# Patient Record
Sex: Female | Born: 1955 | Marital: Single | Smoking: Never smoker
Health system: Southern US, Community
[De-identification: ages and names within clinical notes are randomized; demographics above are authoritative.]

## PROBLEM LIST (undated history)

## (undated) DIAGNOSIS — F32A Depression, unspecified: Secondary | ICD-10-CM

## (undated) DIAGNOSIS — H3530 Unspecified macular degeneration: Secondary | ICD-10-CM

## (undated) DIAGNOSIS — D649 Anemia, unspecified: Secondary | ICD-10-CM

## (undated) DIAGNOSIS — I10 Essential (primary) hypertension: Secondary | ICD-10-CM

## (undated) DIAGNOSIS — R21 Rash and other nonspecific skin eruption: Secondary | ICD-10-CM

## (undated) DIAGNOSIS — IMO0002 Reserved for concepts with insufficient information to code with codable children: Secondary | ICD-10-CM

## (undated) DIAGNOSIS — R55 Syncope and collapse: Secondary | ICD-10-CM

## (undated) DIAGNOSIS — R011 Cardiac murmur, unspecified: Secondary | ICD-10-CM

## (undated) DIAGNOSIS — E785 Hyperlipidemia, unspecified: Secondary | ICD-10-CM

## (undated) DIAGNOSIS — F329 Major depressive disorder, single episode, unspecified: Secondary | ICD-10-CM

## (undated) DIAGNOSIS — H269 Unspecified cataract: Secondary | ICD-10-CM

## (undated) DIAGNOSIS — F509 Eating disorder, unspecified: Secondary | ICD-10-CM

## (undated) DIAGNOSIS — T7840XA Allergy, unspecified, initial encounter: Secondary | ICD-10-CM

## (undated) DIAGNOSIS — M1990 Unspecified osteoarthritis, unspecified site: Secondary | ICD-10-CM

## (undated) DIAGNOSIS — B019 Varicella without complication: Secondary | ICD-10-CM

## (undated) HISTORY — DX: Hyperlipidemia, unspecified: E78.5

## (undated) HISTORY — DX: Anemia, unspecified: D64.9

## (undated) HISTORY — DX: Essential (primary) hypertension: I10

## (undated) HISTORY — DX: Cardiac murmur, unspecified: R01.1

## (undated) HISTORY — DX: Unspecified osteoarthritis, unspecified site: M19.90

## (undated) HISTORY — DX: Major depressive disorder, single episode, unspecified: F32.9

## (undated) HISTORY — DX: Unspecified macular degeneration: H35.30

## (undated) HISTORY — DX: Allergy, unspecified, initial encounter: T78.40XA

## (undated) HISTORY — PX: OTHER SURGICAL HISTORY: SHX169

## (undated) HISTORY — DX: Depression, unspecified: F32.A

## (undated) HISTORY — DX: Eating disorder, unspecified: F50.9

## (undated) HISTORY — DX: Rash and other nonspecific skin eruption: R21

## (undated) HISTORY — DX: Varicella without complication: B01.9

## (undated) HISTORY — DX: Unspecified cataract: H26.9

## (undated) HISTORY — DX: Reserved for concepts with insufficient information to code with codable children: IMO0002

## (undated) HISTORY — DX: Syncope and collapse: R55

---

## 1978-02-15 DIAGNOSIS — IMO0002 Reserved for concepts with insufficient information to code with codable children: Secondary | ICD-10-CM

## 1978-02-15 HISTORY — DX: Reserved for concepts with insufficient information to code with codable children: IMO0002

## 1978-10-17 DIAGNOSIS — R55 Syncope and collapse: Secondary | ICD-10-CM

## 1978-10-17 HISTORY — DX: Syncope and collapse: R55

## 1990-02-15 HISTORY — PX: RETINAL DETACHMENT SURGERY: SHX105

## 2011-05-10 ENCOUNTER — Encounter: Payer: Self-pay | Admitting: Family Medicine

## 2011-05-10 ENCOUNTER — Ambulatory Visit (INDEPENDENT_AMBULATORY_CARE_PROVIDER_SITE_OTHER): Payer: Medicare Other | Admitting: Family Medicine

## 2011-05-10 VITALS — BP 142/82 | HR 86 | Temp 97.3°F | Ht 66.0 in | Wt 389.4 lb

## 2011-05-10 DIAGNOSIS — E785 Hyperlipidemia, unspecified: Secondary | ICD-10-CM

## 2011-05-10 DIAGNOSIS — M7989 Other specified soft tissue disorders: Secondary | ICD-10-CM | POA: Insufficient documentation

## 2011-05-10 DIAGNOSIS — D485 Neoplasm of uncertain behavior of skin: Secondary | ICD-10-CM

## 2011-05-10 DIAGNOSIS — L400 Psoriasis vulgaris: Secondary | ICD-10-CM

## 2011-05-10 DIAGNOSIS — I10 Essential (primary) hypertension: Secondary | ICD-10-CM | POA: Insufficient documentation

## 2011-05-10 DIAGNOSIS — L732 Hidradenitis suppurativa: Secondary | ICD-10-CM

## 2011-05-10 DIAGNOSIS — B353 Tinea pedis: Secondary | ICD-10-CM

## 2011-05-10 DIAGNOSIS — L408 Other psoriasis: Secondary | ICD-10-CM

## 2011-05-10 DIAGNOSIS — G4730 Sleep apnea, unspecified: Secondary | ICD-10-CM

## 2011-05-10 DIAGNOSIS — E039 Hypothyroidism, unspecified: Secondary | ICD-10-CM | POA: Insufficient documentation

## 2011-05-10 LAB — CBC WITH DIFFERENTIAL/PLATELET
Basophils Absolute: 0 10*3/uL (ref 0.0–0.1)
HCT: 47.5 % — ABNORMAL HIGH (ref 36.0–46.0)
Lymphs Abs: 0.8 10*3/uL (ref 0.7–4.0)
MCHC: 33.2 g/dL (ref 30.0–36.0)
MCV: 89 fl (ref 78.0–100.0)
Monocytes Absolute: 0.4 10*3/uL (ref 0.1–1.0)
Platelets: 214 10*3/uL (ref 150.0–400.0)
RDW: 14.7 % — ABNORMAL HIGH (ref 11.5–14.6)

## 2011-05-10 LAB — HEPATIC FUNCTION PANEL
Albumin: 3 g/dL — ABNORMAL LOW (ref 3.5–5.2)
Alkaline Phosphatase: 95 U/L (ref 39–117)
Bilirubin, Direct: 0 mg/dL (ref 0.0–0.3)

## 2011-05-10 LAB — BASIC METABOLIC PANEL
Calcium: 8.7 mg/dL (ref 8.4–10.5)
Creatinine, Ser: 1 mg/dL (ref 0.4–1.2)
GFR: 58.12 mL/min — ABNORMAL LOW (ref >60.00)
Sodium: 141 mEq/L (ref 135–145)

## 2011-05-10 LAB — LIPID PANEL
Cholesterol: 278 mg/dL — ABNORMAL HIGH (ref 0–200)
HDL: 49 mg/dL (ref >39.00)
Total CHOL/HDL Ratio: 6
Triglycerides: 162 mg/dL — ABNORMAL HIGH (ref 0.0–149.0)

## 2011-05-10 LAB — LDL CHOLESTEROL, DIRECT: Direct LDL: 197 mg/dL

## 2011-05-10 MED ORDER — DOXYCYCLINE HYCLATE 100 MG PO TABS: 100.0000 mg | ORAL_TABLET | Freq: Two times a day (BID) | ORAL | Status: AC

## 2011-05-10 NOTE — Progress Notes (Signed)
  Subjective:    Patient ID: Whitney Blackburn, female    DOB: 1955-10-20, 56 y.o.   MRN: [id number]  HPI New to establish.  Has not had MD in 3 yrs due to lack of insurance.  Now disabled due to mobility issues.  HTN- chronic problem, was on Lisinopril HCTZ 10/12.5 mg daily, has not been on meds x3 yrs.  No CP.  + SOB w/ any type of exertion.  Has had 60 lb weight loss in last 3 yrs.  Hyperlipidemia- chronic problem, was on Simvastatin 80mg  daily.  No meds in 3 yrs.  Hypothyroid- chronic problem, was previous on 125 mcg daily, no meds in 3 yrs.  Mobility issues- pt reports 'i'm basically home bound'.  + L leg swelling.  Reports she does not add salt to diet but is limited to processed/frozen dinners w/ high Na content.  Has never seen vascular MD.  Reports that she will have intermittent weeping blisters of L leg.  Rash- only occuring on neck.  Thick, red plaques.  Pt reports this is very itchy.  Foot infection- pt report large purple area on top of foot appeared 9 months ago.  Unable to cut nails.  + fungal infection.  Feet 'tingle' bilaterally.  Eye issues- had retinal detachment in R eye, macular degeneration in L eye.  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  Now w/ cataracts but 'i can't find someone to operate on them'.  Sleep Apnea- pt reports she had the study 10 yrs ago but was unable to tolerate a mask.  Has not had recent study or pulmonary follow up.  Hidradenitis- pt reports 18 months of pain, swelling, fluid collection.  Health maintenance- has never had pap or colonoscopy.  Last mammo 3 yrs ago.   Review of Systems For ROS see HPI     Objective:   Physical Exam  Vitals reviewed. Constitutional: She appears well-developed and well-nourished. No distress.       Morbidly obese, extremely limited mobility  HENT:  Head: Normocephalic and atraumatic.  Neck: Neck supple. No thyromegaly present.  Cardiovascular: Normal rate, regular rhythm, normal heart sounds and intact distal  pulses.   Pulmonary/Chest: Effort normal and breath sounds normal. No respiratory distress. She has no wheezes. She has no rales.  Abdominal: Soft.       Morbidly obese  Musculoskeletal: She exhibits edema (bilateral, +2, pt w/ ulcerated wound on dorsum of L foot).  Lymphadenopathy:    She has no cervical adenopathy.  Skin: Skin is warm and dry. Rash: thick red plaques on neck.       Axillary hidradenitis Toenails thickened, yellow, dystrophic- consistent w/ fungal infxn          Assessment & Plan:

## 2011-05-10 NOTE — Patient Instructions (Signed)
We will notify you of your lab results We will have home health come do an evaluation for possible mobility assistance The care management team will contact you to help w/ transportation and other needs Start the antibiotics for the area under your arm Someone will contact you with your specialist appts Call with any questions or concerns Hang in there!  We're going to get everything taken care of!

## 2011-05-11 ENCOUNTER — Telehealth: Payer: Self-pay | Admitting: Family Medicine

## 2011-05-11 NOTE — Telephone Encounter (Signed)
Myriam Jacobson with [facility] office is assisting with my referrals.  She had this patient scheduled for all her referral appointments entered yesterday, 05-10-11, called patient, and patient states she doesn't want any of the appointments.  Patient told Myriam Jacobson, she only wants the Home Health referral in which Tresa Moore is processing.

## 2011-05-11 NOTE — Telephone Encounter (Signed)
Please advise 

## 2011-05-11 NOTE — Telephone Encounter (Signed)
At this point I'm not sure what to do.  Pt has multiple serious health concerns that need to be evaluated by specialists.  If she is not willing to follow treatment plan, we may need to talk about finding another doctor.

## 2011-05-11 NOTE — Telephone Encounter (Signed)
Called Michelle at [employer] to advise pt need for transportation, was advised company currently does not assist with [employer] however to check back again in the future to see if this is later accepted. MD Beverely Low made aware verbally.

## 2011-05-12 ENCOUNTER — Encounter: Payer: Self-pay | Admitting: *Deleted

## 2011-05-13 MED ORDER — LEVOTHYROXINE SODIUM 100 MCG PO TABS: 100.0000 ug | ORAL_TABLET | Freq: Every day | ORAL | Status: AC

## 2011-05-13 MED ORDER — HYDROCHLOROTHIAZIDE 12.5 MG PO CAPS: 12.5000 mg | ORAL_CAPSULE | Freq: Every day | ORAL | Status: DC

## 2011-05-13 MED ORDER — ATORVASTATIN CALCIUM 40 MG PO TABS: 40.0000 mg | ORAL_TABLET | Freq: Every day | ORAL | Status: DC

## 2011-05-13 NOTE — Telephone Encounter (Signed)
Spoke to pt to advise results/instructions. Pt understood. Pt requested to have all her medications mailed to her from this point on per she needs to verify how much they are per her finances, pt also notes that she does not want to set her referral apts up per she is not set up with transportation at this point pt noted that the apts for referrals need to be placed in the afternoon per it takes her so long to get ready sometimes hours, pt also wants the referrals spaced out due to her financial state and unable to pay all the co-pays involved , pt also wanted to review the MD offices that we are sending her to per not all MD offices are NOT handicap accessible per pt notes that requirement for her is that they have no steps, as well as need to have chairs with no arms on them so she can fit into the chair, pt then noted that our office does not have steps and we do have the sofa without arms however the sofa was too low for her to be able to get up, pt also noted our office does not have armless chairs in the lab dept of our office. I advised pt that we will send the medication prescriptions to her mail so she can review before sending them off to her pharmacy, address was clarified as well, advised that we have contacted [facility] with a referral for home health and that someone will contact her from the office concerning coming out for evaluation, also noted [facility] has been notified to contact pt concerning transportation needs and that someone from that office will contact her about that as well, noted that I will let my manager know about her concerns with the chairs in the lobby and lab, I also advised that I would inform MD Tabori about her needs for referral office information prior to apt and her financial needs to spread the apts apart, advised referral coordinator that pt is waiting on transportation to be set up before accepting referrals.

## 2011-05-20 ENCOUNTER — Other Ambulatory Visit: Payer: Self-pay

## 2011-05-20 DIAGNOSIS — M7989 Other specified soft tissue disorders: Secondary | ICD-10-CM

## 2011-05-21 ENCOUNTER — Telehealth: Payer: Self-pay | Admitting: *Deleted

## 2011-05-21 NOTE — Telephone Encounter (Signed)
Whitney Blackburn with [facility] to advise that pt needs transportation to appointments per was advised on last phone call to [facility] that at the present time pt insurance was not being accepted, Marcelino Duster advised they will reach out to the pt, called pt to advise update, pt understood.

## 2011-05-23 NOTE — Assessment & Plan Note (Signed)
New.  Pt has been off BP meds x3 yrs.  Restart HCTZ to improve BP and improve swelling.

## 2011-05-23 NOTE — Assessment & Plan Note (Signed)
New.  Refer to derm. 

## 2011-05-23 NOTE — Assessment & Plan Note (Signed)
New to provider.  Chronic problem for pt.  Start doxy.  Refer to general surgery for continued management.

## 2011-05-23 NOTE — Assessment & Plan Note (Signed)
New to provider.  Pt's biggest health concern at this time.  Cause of disability, limited mobility, and largest risk for comorbidity and death.  Check labs b/c pt is at high risk for DM.  Set pt up w/ care management services to get pt transportation and home health eval to assess DME needs/home safety.

## 2011-05-23 NOTE — Assessment & Plan Note (Signed)
New to provider.  Hx of similar.  Check labs to determine appropriate meds.

## 2011-05-23 NOTE — Assessment & Plan Note (Signed)
New to provider- obviously chronic for pt.  Refer to podiatry for tx of dystrophic nails and foot wound.

## 2011-05-23 NOTE — Assessment & Plan Note (Signed)
New.  Pt w/ bilateral sxs and wound on dorsum of foot.  Start doxy.  Refer to podiatry and vascular surgery for evaluation and tx.  Reviewed supportive care and red flags that should prompt return.  Pt expressed understanding and is in agreement w/ plan.

## 2011-05-23 NOTE — Assessment & Plan Note (Signed)
New to provider.  Check labs.  Determine starting dose of synthroid.

## 2011-05-23 NOTE — Assessment & Plan Note (Signed)
New to provider.  Hx of similar.  Not currently on CPAP.  Needs pulm referral.

## 2011-06-02 ENCOUNTER — Institutional Professional Consult (permissible substitution): Payer: Medicare Other | Admitting: Pulmonary Disease

## 2011-06-15 ENCOUNTER — Encounter: Payer: Self-pay | Admitting: Family Medicine

## 2011-06-15 NOTE — Telephone Encounter (Signed)
Please note incoming email, vm has been left again with [facility] to confirm transportation

## 2011-06-16 ENCOUNTER — Telehealth: Payer: Self-pay | Admitting: *Deleted

## 2011-06-16 ENCOUNTER — Encounter: Payer: Self-pay | Admitting: Family Medicine

## 2011-06-16 NOTE — Telephone Encounter (Signed)
Marcelino Duster from [facility] called to advise that pt case manager Ellenor [phone] has spoken to the social worker for the pt and they will contact the pt this afternoon about transportation, MD Tabori made aware verbally

## 2011-06-16 NOTE — Telephone Encounter (Signed)
Late entry: left vm for michelle with [facility] on 06-15-11 at 5:30pm to advise the transportation we discussed for this pt has not been set up as of present time, pt has an apt on 06-30-11 that she needs to attend. Left my personal extension to call back, per pt sent email via mychart about concern     This nurse left vm again today at 8:53am for [facility]  to call back to my extension per MD Beverely Low now aware of the concern and this is an important matter for the pt to have transportation to her apt(s) and especially on 06-30-11.

## 2011-06-21 ENCOUNTER — Telehealth: Payer: Self-pay | Admitting: *Deleted

## 2011-06-21 ENCOUNTER — Telehealth: Payer: Self-pay | Admitting: Family Medicine

## 2011-06-21 NOTE — Telephone Encounter (Signed)
Left Vm with Ellenor with [facility] to make sure the social worker did speak to the pt about her transportation for upcoming apt on 06-30-11.

## 2011-06-21 NOTE — Telephone Encounter (Signed)
Dismissal Letter sent by Certified Mail 06/21/2011  Return Receipt received showing the patient has the Dismissal Letter 06/23/2011

## 2011-06-22 NOTE — Telephone Encounter (Signed)
Was advised by [facility] rep Ellenor that she did have transportation set up for this pt for her upcoming MD apt on May 15th and that a Child psychotherapist is scheduled to reach out to the pt in home next week to apply for the SCAT program to assist with further transportation needs, pt is aware, Ellenor wanted to notify MD Beverely Low about some concerns with the pt per noted several concerns that the pt refused to address, Ellenor discussed the pt need to attend the apt MD Tabori set up for her concerning the specialist and current health concerns, pt refused advice, pt declined offer for a diet/exercise program to be set up for her, pt also declined offer for a wheelchair per pt complaint of mobility, pt noted that she does not want to pay for any services, Ellenor is scheduled to update with pt in home at the end of the month to see how her apt was and any concerns she may have, pt was hesitant to have Ellenor return,however did agree to have her come back out, MD Tabori made aware verbally

## 2011-06-23 ENCOUNTER — Telehealth: Payer: Self-pay

## 2011-06-23 NOTE — Telephone Encounter (Signed)
Pt. Called to inquire about office accommodations for larger-size patients.  Asking about accessibility of wide chairs, wider stools with handrail, bed availability for larger pt's.  Advised pt. our exam bed can accommodate pts. up to 400#, and our vascular lab bed can accommodate patients up to 500#; our waiting area has a wide-2-seated chair without handle in the middle; door frames are standard 34"clearance.  Pt. states she is not in a w/c, but has difficulty walking very far, before she needs to sit down.  Also states she has to be able to have wide step stool with a strong, supportive handle.  Spoke with vascular lab tech, and advised that scheduling the vascular study at [facility], may be more appropriate to get a better vascular evaluation, in an obese patient.  Pt. stated she prefers to do everything at one office/one stop, because she relies on outside transportation.  She does not want to schedule vascular test at the hospital.  States she is mostly "bedridden", and has difficulty moving around.  Advised pt. will keep her appt., as previously scheduled at office, but will share this discussion with the Vascular Lab Manager, and she may call pt.

## 2011-06-29 ENCOUNTER — Encounter: Payer: Self-pay | Admitting: Vascular Surgery

## 2011-06-30 ENCOUNTER — Encounter (INDEPENDENT_AMBULATORY_CARE_PROVIDER_SITE_OTHER): Payer: Medicare Other

## 2011-06-30 ENCOUNTER — Encounter: Payer: Self-pay | Admitting: Vascular Surgery

## 2011-06-30 ENCOUNTER — Ambulatory Visit (INDEPENDENT_AMBULATORY_CARE_PROVIDER_SITE_OTHER): Payer: Medicare Other | Admitting: Vascular Surgery

## 2011-06-30 VITALS — BP 146/82 | HR 58 | Resp 18 | Ht 68.0 in | Wt 380.0 lb

## 2011-06-30 DIAGNOSIS — M79609 Pain in unspecified limb: Secondary | ICD-10-CM

## 2011-06-30 DIAGNOSIS — I872 Venous insufficiency (chronic) (peripheral): Secondary | ICD-10-CM

## 2011-06-30 DIAGNOSIS — M7989 Other specified soft tissue disorders: Secondary | ICD-10-CM

## 2011-06-30 NOTE — Assessment & Plan Note (Signed)
I believe that her leg swelling is secondary to combined chronic venous insufficiency and lymphedema. I've explained to her that this is a chronic problem. We have reviewed the treatment of this. She understands that the main stay of treatment is elevation and compression therapy. I've discussed with her the proper position for elevating her legs. Once her swelling has improved she could potentially be fitted for compression stockings. We have also discussed the importance of weight loss. I've also instructed her to keep the skin well-lubricated to prevent cracks which can start venous ulcers. I'll be happy to see her back if her symptoms progress or any new vascular issues arise.

## 2011-06-30 NOTE — Progress Notes (Signed)
Vascular and Vein Specialist of [facility]  Patient name: Whitney Blackburn MRN: [id number] DOB: 20-May-1955 Sex: female  REASON FOR CONSULT: bilateral lower extremity swelling, left greater than right  HPI: Whitney Blackburn is a 56 y.o. female who has had swelling in both lower extremities off and on for 10 years.  She is unaware of any previous history of DVT or phlebitis. She denies any previous abdominal surgery groin surgery or radiation therapy. She states that the swelling had been worse recently where she was having wounds in her left leg which were weeping serous fluid. She denies having had any venous ulcers however. She is on disability because of arthritis in her left knee and is essentially bedridden because of her morbid obesity.  Past Medical History  Diagnosis Date  . Arthritis   . Chicken pox   . Depression   . Fainting spell 1980's    noted in 1980's  . Eating disorder     1980's  . Glaucoma   . Ulcer 1980  . Allergy   . Heart murmur   . Hyperlipidemia   . Hypertension     noted high readings in the past, per has not been to MD in 3 years  . Skin rash   . Macular degeneration   . Anemia   . Cataracts, bilateral     Family History  Problem Relation Age of Onset  . Hyperlipidemia Mother   . Crohn's disease Father   . Early death Father   . Diabetes Sister   . Crohn's disease Paternal Uncle   . Hyperlipidemia Maternal Grandmother   . Hypertension Maternal Grandmother   . Hyperlipidemia Maternal Grandfather   . Hypertension Maternal Grandfather     SOCIAL HISTORY: History  Substance Use Topics  . Smoking status: Never Smoker   . Smokeless tobacco: Not on file  . Alcohol Use: No    Allergies  Allergen Reactions  . Dicloxacillin   . Shellfish Allergy     Current Outpatient Prescriptions  Medication Sig Dispense Refill  . atorvastatin (LIPITOR) 40 MG tablet Take 1 tablet (40 mg total) by mouth daily.  90 tablet  1  . hydrochlorothiazide (MICROZIDE)  12.5 MG capsule Take 1 capsule (12.5 mg total) by mouth daily.  90 capsule  1  . levothyroxine (SYNTHROID, LEVOTHROID) 100 MCG tablet Take 1 tablet (100 mcg total) by mouth daily.  90 tablet  1  . multivitamin-iron-minerals-folic acid (CENTRUM) chewable tablet Chew 1 tablet by mouth daily.        REVIEW OF SYSTEMS: Arly.Keller ] denotes positive finding; [  ] denotes negative finding  CARDIOVASCULAR:  [ ]  chest pain   [ ]  chest pressure   [ ]  palpitations   [ ]  orthopnea   Arly.Keller ] dyspnea on exertion   [ ]  claudication   [ ]  rest pain   [ ]  DVT   [ ]  phlebitis PULMONARY:   [ ]  productive cough   [ ]  asthma   [ ]  wheezing NEUROLOGIC:   [ ]  weakness  [ ]  paresthesias  [ ]  aphasia  [ ]  amaurosis  [ ]  dizziness HEMATOLOGIC:   [ ]  bleeding problems   [ ]  clotting disorders MUSCULOSKELETAL:  Arly.Keller ] joint pain left knee  [ ]  joint swelling [ ]  leg swelling GASTROINTESTINAL: [ ]   blood in stool  [ ]   hematemesis GENITOURINARY:  [ ]   dysuria  [ ]   hematuria PSYCHIATRIC:  [ ]  history of major depression INTEGUMENTARY:  [  X ] rashes left leg [ ]  ulcers CONSTITUTIONAL:  [ ]  fever   [ ]  chills  PHYSICAL EXAM: Filed Vitals:   06/30/11 1421  BP: 146/82  Pulse: 58  Resp: 18  Height: 5\' 8"  (1.727 m)  Weight: 380 lb (172.367 kg)   Body mass index is 57.78 kg/(m^2). GENERAL: The patient is a well-nourished female, in no acute distress. The vital signs are documented above. CARDIOVASCULAR: There is a regular rate and rhythm without significant murmur appreciated. I do not detect carotid bruits. I am unable to palpate pedal pulses however both feet are warm and well-perfused. She has bilateral lower extremity swelling. This is more significant on the left side. She has nonpitting edema on the dorsum of both feet again more so on the left. PULMONARY: There is good air exchange bilaterally without wheezing or rales. ABDOMEN: Soft and non-tender with normal pitched bowel sounds.  MUSCULOSKELETAL: There are no major  deformities or cyanosis. NEUROLOGIC: No focal weakness or paresthesias are detected. SKIN: she has hyperpigmentation in the left leg.PSYCHIATRIC: The patient has a normal affect.  DATA:  Lab Results  Component Value Date   WBC 7.9 05/10/2011   HGB 15.8* 05/10/2011   HCT 47.5* 05/10/2011   MCV 89.0 05/10/2011   PLT 214.0 05/10/2011   Lab Results  Component Value Date   NA 141 05/10/2011   K 3.9 05/10/2011   CL 104 05/10/2011   CO2 26 05/10/2011   Lab Results  Component Value Date   CREATININE 1.0 05/10/2011   Lab Results  Component Value Date   HGBA1C 5.6 05/10/2011   I have independently interpreted her venous study which shows no evidence of DVT on the left. She has reflux in the left greater saphenous vein and also reflux in the common femoral vein on the left.  MEDICAL ISSUES: Leg swelling I believe that her leg swelling is secondary to combined chronic venous insufficiency and lymphedema. I've explained to her that this is a chronic problem. We have reviewed the treatment of this. She understands that the main stay of treatment is elevation and compression therapy. I've discussed with her the proper position for elevating her legs. Once her swelling has improved she could potentially be fitted for compression stockings. We have also discussed the importance of weight loss. I've also instructed her to keep the skin well-lubricated to prevent cracks which can start venous ulcers. I'll be happy to see her back if her symptoms progress or any new vascular issues arise.    Liviana Mills S [facility] of [place]: [phone]

## 2011-07-08 NOTE — Procedures (Unsigned)
LOWER EXTREMITY VENOUS REFLUX EXAM  INDICATION:  Left lower extremity venous insufficiency and edema.  EXAM:  Using color-flow imaging and pulse Doppler spectral analysis, the left common femoral, superficial femoral, popliteal, posterior tibial, greater and lesser saphenous veins are evaluated.  There is evidence suggesting deep venous insufficiency in the left lower extremity in the common femoral vein.  The left saphenofemoral junction is not competent with Reflux of >500 milliseconds. The left GSV is not competent with Reflux of >500 milliseconds with the caliber as described below.)   The left proximal short saphenous vein demonstrates competency.  GSV Diameter (used if found to be incompetent only)                                           Right    Left Proximal Greater Saphenous Vein           cm       0.94 cm Proximal-to-mid-thigh                     cm       0.87 cm Mid thigh                                 cm       0.48 cm Mid-distal thigh                          cm       cm Distal thigh                              cm       0.68 cm Knee                                      cm       0.40 cm   IMPRESSION: 1. The left greater saphenous vein is not competent with reflux >500     milliseconds. 2. The left greater saphenous vein is not tortuous. 3. The deep saphenous vein is not competent with Reflux of >500     milliseconds. 4. The left lesser saphenous vein is competent.           ___________________________________________ Di Kindle. Edilia Bo, M.D.  CI/MEDQ  D:  06/30/2011  T:  06/30/2011  Job:  [id number]

## 2011-08-06 ENCOUNTER — Telehealth: Payer: Self-pay | Admitting: Family Medicine

## 2011-08-06 NOTE — Telephone Encounter (Signed)
Levert Feinstein of home health plan of care 6.20.13 - gave to OM Janna Arch, whom consulted medical staff. Patient has been dismissed from practice, advised to call [facility] & advise no longer PCP and have no new PCP listed on file. spoek to [employer] that answers phone & then Moore.

## 2011-08-06 NOTE — Telephone Encounter (Signed)
Noted  

## 2012-04-01 ENCOUNTER — Other Ambulatory Visit: Payer: Self-pay

## 2012-12-21 ENCOUNTER — Other Ambulatory Visit: Payer: Self-pay

## 2017-12-20 ENCOUNTER — Ambulatory Visit: Payer: Medicare Other | Admitting: Podiatry

## 2017-12-20 VITALS — BP 153/78

## 2017-12-20 DIAGNOSIS — I890 Lymphedema, not elsewhere classified: Secondary | ICD-10-CM | POA: Diagnosis not present

## 2017-12-20 DIAGNOSIS — M79674 Pain in right toe(s): Secondary | ICD-10-CM | POA: Diagnosis not present

## 2017-12-20 DIAGNOSIS — B351 Tinea unguium: Secondary | ICD-10-CM

## 2017-12-20 DIAGNOSIS — M79675 Pain in left toe(s): Secondary | ICD-10-CM

## 2017-12-20 NOTE — Patient Instructions (Signed)

## 2018-01-07 ENCOUNTER — Encounter: Payer: Self-pay | Admitting: Podiatry

## 2018-01-07 NOTE — Progress Notes (Signed)
Subjective: Whitney Blackburn presents to clinic today with multiple concerns.  She states she has seen a podiatrist in the past is been over 10 years ago.  She is here today for her toenails which are elongated, painful, and discolored.  Due to her mobility issues she cannot cut them herself.  She also has chronic lymphedema left greater than right.  She states her left foot swells more so on occasion.  Lastly she states that there is a black spot on her right foot that is come up and she cannot get it off.  She is also requesting compression stockings to help her with her edema.    Past Medical History:  Diagnosis Date  . Allergy   . Anemia   . Arthritis   . Cataracts, bilateral   . Chicken pox   . Depression   . Eating disorder    1980's  . Fainting spell 1980's   noted in 1980's  . Glaucoma   . Heart murmur   . Hyperlipidemia   . Hypertension    noted high readings in the past, per has not been to MD in 3 years  . Macular degeneration   . Skin rash   . Ulcer 1980   Patient Active Problem List   Diagnosis Date Noted  . Unspecified venous (peripheral) insufficiency 06/30/2011  . Pain in limb 06/30/2011  . HTN (hypertension) 05/10/2011  . Hyperlipidemia 05/10/2011  . Hypothyroid 05/10/2011  . Leg swelling 05/10/2011  . Hidradenitis axillaris 05/10/2011  . Plaque psoriasis 05/10/2011  . Fungal infection of foot 05/10/2011  . Neoplasm of uncertain behavior of skin 05/10/2011  . Sleep apnea 05/10/2011  . Morbid obesity ([place]) 05/10/2011   Past Surgical History:  Procedure Laterality Date  . retina    . RETINAL DETACHMENT SURGERY  1992   Current Outpatient Medications:  .  acetaminophen (TYLENOL) 500 MG tablet, Take 500 mg by mouth every 6 (six) hours as needed., Disp: , Rfl:  .  amitriptyline (ELAVIL) 50 MG tablet, Take 50 mg by mouth at bedtime., Disp: , Rfl:  .  atorvastatin (LIPITOR) 40 MG tablet, Take 40 mg by mouth daily., Disp: , Rfl:  .  Cholecalciferol (VITAMIN  D3) 1000 UNIT/SPRAY LIQD, Take 500 Units by mouth., Disp: , Rfl:  .  fenofibrate 54 MG tablet, Take 54 mg by mouth daily., Disp: , Rfl:  .  glipiZIDE (GLUCOTROL) 5 MG tablet, Take by mouth daily before breakfast., Disp: , Rfl:  .  hydrochlorothiazide (HYDRODIURIL) 25 MG tablet, Take 25 mg by mouth daily., Disp: , Rfl:  .  Levothyroxine Sodium 13 MCG/ML SOLN, Take by mouth., Disp: , Rfl:  .  lisinopril (PRINIVIL,ZESTRIL) 40 MG tablet, Take 40 mg by mouth daily., Disp: , Rfl:  .  Melatonin 3 MG TABS, Take 3 mg by mouth., Disp: , Rfl:  .  metFORMIN (GLUCOPHAGE-XR) 750 MG 24 hr tablet, Take 750 mg by mouth daily with breakfast., Disp: , Rfl:  .  Multiple Vitamins-Minerals (CENTRUM SILVER 50+WOMEN PO), Take by mouth., Disp: , Rfl:  .  multivitamin-iron-minerals-folic acid (CENTRUM) chewable tablet, Chew 1 tablet by mouth daily., Disp: , Rfl:  .  Potassium 99 MG TABS, Take 99 mg by mouth., Disp: , Rfl:  .  ranitidine (ZANTAC) 150 MG tablet, Take 150 mg by mouth 2 (two) times daily., Disp: , Rfl:  .  hydrochlorothiazide (MICROZIDE) 12.5 MG capsule, Take 1 capsule (12.5 mg total) by mouth daily., Disp: 90 capsule, Rfl: 1 .  levothyroxine (  SYNTHROID, LEVOTHROID) 100 MCG tablet, Take 1 tablet (100 mcg total) by mouth daily., Disp: 90 tablet, Rfl: 1 .  levothyroxine (SYNTHROID, LEVOTHROID) 100 MCG tablet, , Disp: , Rfl:   Allergies  Allergen Reactions  . Dicloxacillin   . Shellfish Allergy    Social History   Tobacco Use  Smoking Status Never Smoker  Smokeless Tobacco Never Used   Family History  Problem Relation Age of Onset  . Hyperlipidemia Mother   . Crohn's disease Father   . Early death Father   . Diabetes Sister   . Crohn's disease Paternal Uncle   . Hyperlipidemia Maternal Grandmother   . Hypertension Maternal Grandmother   . Hyperlipidemia Maternal Grandfather   . Hypertension Maternal Grandfather     Objective: Vitals:   12/20/17 1411  BP: (!) 153/78   Vascular  Examination: Capillary refill time immediate x 10 digits Dorsalis pedis palpable bilaterally  Posterior tibial pulses I am unable to palpate secondary to edema No digital hair x 10 digits Skin temperature gradient within normal limits bilaterally Pedal edema noted, consistent with lymphedema changes left greater than right.  Dermatological Examination: Skin texture is consistent with chronic lymphedema. Toenails 1-5 b/l discolored, thick, dystrophic with subungual debris and pain with palpation to nailbeds due to thickness of nails. The dark spots she is referring to is a dry skin plaque.  When removed, normal skin integument noted. No open wounds bilaterally No interdigital macerations noted bilaterally  Musculoskeletal: Muscle strength 5/5 to all LE muscle groups  Neurological: Sensation intact with 10 gram monofilament. Vibratory sensation intact.  Assessment: Painful onychomycosis toenails 1-5 b/l  Lymphedema bilateral lower extremities   Plan: 1. Toenails 1-5 b/l were debrided in length and girth without iatrogenic bleeding. 2. For lymphedema, a prescription written for a pair of custom thigh-high stockings 20 to 30 mmHg.  She will obtain from [facility] supply. 3. Patient to continue soft, supportive shoe gear 4. Patient to report any pedal injuries to medical professional immediately. 5. Follow up 3 months. Patient/POA to call should there be a concern in the interim.

## 2019-06-08 ENCOUNTER — Other Ambulatory Visit: Payer: Self-pay | Admitting: *Deleted

## 2019-06-08 NOTE — Patient Outreach (Signed)
[place] [facility]) Care Management  06/08/2019  Whitney Blackburn [birth year] [id number]   Telephone Assessment  RN initial outreach attempt however unsuccessful. RN able to leave a HIPAA approved voice message requesting a call back.  Plan: Will attempted another follow up over the next 2 weeks.  [name], RN Care Management Coordinator [place] Office [phone]

## 2019-06-20 ENCOUNTER — Other Ambulatory Visit: Payer: Self-pay | Admitting: *Deleted

## 2019-06-20 NOTE — Patient Outreach (Signed)
[facility]) Care Management  06/20/2019  Roshni Mccrite Jul 15, 1955 [id number]  MD referral Telephone Assessment Outreach #2  RN attempted outreach however unsuccessful. RN able to leave a HIPAA approved voice message requesting a call back for pending services.   Plan: Will reach out once again next week and inquire further.  [name], RN Care Management Coordinator [place] Office [phone]

## 2019-06-28 ENCOUNTER — Encounter: Payer: Self-pay | Admitting: *Deleted

## 2019-06-28 ENCOUNTER — Other Ambulatory Visit: Payer: Self-pay | Admitting: *Deleted

## 2019-06-28 NOTE — Patient Outreach (Signed)
[place] [facility]) Care Management  06/28/2019  Whitney Blackburn October 23, 1955 [id number]   Telephone Assessment-Successful-Enrolled for Diabetic Program  RN spoke with pt today and once again introduced the [facility] program and services. Pt receptive and continue to express her issues. States she is obese (380 lbs) and very limited with her mobility and standing ability. States she does not have a bariatric wheelchair only a walker and it is very difficult for the medical providers office to accomodates her needs when she appears for her appointments. Pt is unable to stand long due to her left leg lipidemia (no open wounds at this time). Therefore pt more home bound and does not attend to many office visits. If she needs to she gets her sister Whitney Blackburn) to transport when necessary. Pt will have a virtual appointment today with her primary care provider Dr. Addison Blackburn who follows her for her diabetes (last office visit was in March).  Reports at that time her A1c was around 8.1 but due to her sensitivity to her fingers as pt entertains herself daily with use of her computer and watching TV she has not been taking her BS. Pt is not sure when her provider will obtain another A1c due to her limitations for an office visit but she can report her readings to her provider starting today when her doctor calls she will completed the first BS test and present the reading to Dr. Addison Blackburn.  States she orders her groceries online for a 2 month supply and does not eat a lot of fresh fruits and vegetables with two meals daily. Pt admits she eats more regular food and that are not diabetic friendly but will to consider meal plans that are diabetic friendly if possible.   Diabetes-Will educate and discuss diabetes and how this will effect her ongoing medical issues. Will obtain as much information as possible and find the source of pt's problem related to her increased levels. Pt admits her issues is her diet and  will to make a change if needed with eating more diabetic friendly meals. Will send educational material and continue to discuss on the follow up calls.  [facility] referral for a Social work consult-Based upon the needs for a Bariatric wheelchair, sources of transportation (possibly SCATS) and diabetic friendly meals through mail order  for a [facility] social worker to assist further with this request. Diabetes A1C/BS/Diet/DME-Discussed the importance of A1c and daily BS in order to know what her readings are related to her daily consumption of foods and how she is managing her diabetes overall. Inquired if pt would be willing to at least perform a BS once weekly to start. Pt agreed to a weekly BS and will document all her readings to provide to her providers and this RN case manager on the next follow up call.   Will praise pt for her efforts in starting to managing her care with the first BS today prior to her provider's appointment on the virtual call. Will continue to encourage pt on her participation in managing her diabetes. Will continue to educate and stress the importance and offer to send printer information via [employer] that will continue to be discussed on the upcoming calls. Once offered for enrollment in to a diabetic program as pt agreed. Discussions around diabetes began with obtaining a BS and a plan of care related to the above needs were generated along with a referral for a social worker to assist pt further. All goals and interventions were discussed along  with "sick days" and when to seek immediate medical services with acute readings and contact her provider. Pt verbalized an understanding and agreed to monthly follow up calls for [facility] services at this time. No other issues or inquires at this time.  [facility] CM Care Plan Problem One     Most Recent Value  Care Plan Problem One  Knowledge Deficit of diabetes control related to dietary modifications  Role Documenting the Problem One  Care Management  Telephonic Coordinator  Care Plan for Problem One  Active  [facility] Long Term Goal   Pt will be able to creat a menu for the week choosing appropriate foods in the next 90 days  [facility] Long Term Goal Start Date  06/28/19  Interventions for Problem One Long Term Goal  Willl discuss food types: simple carbs, proteins fats. WIll send printable material on diabetic diet exchanges and educate on food groups and how this affects her overall health.  [facility] CM Short Term Goal #1   Pt will monitor her blood sugars weekly and keep a log for the next 30 days.  [facility] CM Short Term Goal #1 Start Date  06/28/19  Interventions for Short Term Goal #1  Will discuss the importance of monitoring her blood sugar and verified pt her the equipment for completing this task.  Will stress the importance of daily BS however pt has only agreed to start with weekly.  [facility] CM Short Term Goal #2   Pt will be acceptable to the referral for a social worker to assist with transportation sources for her medical appointments over the next 3 weeks.  [facility] CM Short Term Goal #2 Start Date  06/28/19  Interventions for Short Term Goal #2  Will discuss the importance of attending all medical appointments to continue avoiding any acute problems fro occurring. Will make a referral for a social worker to assist pt with a transportation sources based upon her needs.      Whitney Mina, RN Care Management Coordinator [place] Office [phone]

## 2019-06-29 ENCOUNTER — Encounter: Payer: Self-pay | Admitting: *Deleted

## 2019-06-29 ENCOUNTER — Other Ambulatory Visit: Payer: Self-pay | Admitting: *Deleted

## 2019-06-29 NOTE — Patient Outreach (Signed)
[facility] ([facility]) Care Management  06/29/2019  Raeli Krienke 02/02/1956 [id number]  CSW was able to make initial contact with patient today to perform phone assessment, as well as assess and assist with social work needs and services.  CSW introduced self, explained role and types of services provided through [facility] ([facility] Care [facility]).  CSW further explained to patient that CSW works with patient's RNCM, also with [facility] Care Management, Lisa Matthews.  CSW then explained the reason for the call, indicating that Ms. Matthews thought that patient would benefit from social work services and resources to assist with obtaining a lightweight bariatric wheelchair, assistance with arranging public transportation to and from all of her physician appointments and assistance with referring patient to a meal delivery program, one specifically designed for individuals that suffer from Morbid Obesity and Diabetes.  CSW obtained two HIPAA compliant identifiers from patient, which included patient's name and date of birth.  Patient admitted that she needs assistance obtaining financial resources and/or funding to cover the expense of a lightweight bariatric wheelchair.  Patient would like to be able to leave her own home, in addition to being able to transport herself to and from her physician appointments.  Currently, patient is having to rely on her sister, Jan Thompson to transport her.  Patient indicated that she is able to ambulate short distances, like in and around her home with a walker, but would really benefit from purchasing a lightweight bariatric wheelchair for when she needs to leave her home or attend physician appointments.  Patient stated that her biggest dilemma at present is being able to find a bariatric wheelchair large enough to accommodate her weight of 380 lbs., with a seat measuring at least 26 inches wide, admitting that her hips are the largest part  of her body.  In addition, patient needs to be able to load the lightweight bariatric wheelchair into a vehicle independently.    Patient denied having an interest in applying for public transportation, through SCAT (Specialized Community Area [employer]) or Senior Wheels, with [employer] [place], admitting that she will continue to rely on Mrs. Thompson to transport her wherever she needs to go, especially to and from her physician appointments.  Mrs. Thompson is able to assist patient with loading her current wheelchair into and out of her vehicle, as well as push patient around in the wheelchair, so that patient does not have to try and maneuver independently.  Patient reported that she has already tried to special order a lightweight bariatric wheelchair in the past; however, when the wheelchair arrived, it was too small to comfortably fit her waist and hips, even after approximate measurements were taken from a representative with the manufacturer.  CSW agreed to perform an extensive search of all local agencies providing lightweight bariatric wheelchairs.   CSW was able to complete her search, emailing ([email or web address]) patient a complete listing, per her request.  Patient is aware that she will need to obtain an order of medical necessity for the lightweight bariatric wheelchair, from her Primary Care Physician, Dr. Wendy McNeill, to submit to her insurance provider, [employer], for 80% coverage and reimbursement.  Patient admitted that she did not wish to have CSW arrange meal delivery services for her, stating that she is definitely not interested in receiving frozen dinners, nor was patient interested in having CSW refer her for Mom's Meals, or even Mobile Meals, also through [facility] [facility] [place].  Patient indicated   that she is able to perform all activities of daily living independently, even meal preparation, but that Mrs. Thompson  assists her with grocery shopping, running errands, performing light housekeeping duties, laundry, medication administration and bill payment.   CSW will perform a case closure on patient, as all goals of treatment have been met from social work standpoint and no additional social work needs have been identified at this time.  CSW will notify patient's RNCM with [facility], Lisa Matthews of CSW's plans to close patient's case.  CSW will fax an update to patient's Primary Care Physician, Dr. Wendy McNeill to ensure that they are aware of CSW's involvement with patient's plan of care.  CSW was able to confirm with patient receipt of the list of resources that CSW emailed to her.  Patient denied having any specific questions at present.  CSW provided patient with CSW's contact information, encouraging patient to contact CSW directly if additional social work needs arise in the near future, or if patient requires assistance with obtaining the lightweight bariatric wheelchair.  Patient voiced understanding and was agreeable to this plan, most appreciative of the call and of CSW's assistance.    Joanna Saporito, BSW, MSW, LCSW  Licensed Clinical Social Worker  [facility] [place] [facility]  Mailing Address-[address], [place], [place] [address] Physical Address-[address], [place], [place] [address] Toll Free Main # [phone] Fax # [phone] Cell # [phone]  Office # [phone] [email or web address]        

## 2019-07-05 ENCOUNTER — Ambulatory Visit: Payer: Self-pay | Admitting: *Deleted

## 2019-07-26 ENCOUNTER — Other Ambulatory Visit: Payer: Self-pay | Admitting: *Deleted

## 2019-07-26 NOTE — Patient Outreach (Signed)
[place] [facility]) Care Management  07/26/2019  Whitney Blackburn [birth year] [id number]   Telephone Assessment-Unsuccessful  RN attempted outreach call today however unsuccessful. RN able to leave a HIPAA approved voice message requesting a call back.  Plan: Will scheduled another outreach call over the next week for ongoing [facility] services.  [name], RN Care Management Coordinator [place] Office [phone]

## 2019-07-31 ENCOUNTER — Other Ambulatory Visit: Payer: Self-pay | Admitting: *Deleted

## 2019-07-31 NOTE — Patient Outreach (Signed)
[place] [facility]) Care Management  07/31/2019  Farhana Fellows 01/10/1956 [id number]   Telephone Assessment-Unsuccessful  RN attempted outreach call to pt however unsuccessful. RN able to leave a HIPAA approved voice message requesting a call back.  PLAN: Will further engage with update on pt's ongoing management of care at that time. Will rescheduled another call next week.  [name], RN Care Management Coordinator [place] Office [phone]

## 2019-08-07 ENCOUNTER — Other Ambulatory Visit: Payer: Self-pay | Admitting: *Deleted

## 2019-08-07 NOTE — Patient Outreach (Signed)
[place] [facility]) Care Management  08/07/2019  Tinslee Klare [birth year] [id number]   Telephone Assessment  RN spoke with pt today and reintroduced [facility] and the purpose for today's call. Pt receptive and once again explained all her needs. RN thoroughly reviewed resources and reiterated on the recent conversation pt had with the social worker last month on the available resources.  Pt had requested specifically information and the social worker discussed the following on  transportation with [employer], Meals- mobile meals and [employer], DME- related to a bariatric wheelchair resources. Most services social worker documented pt opt to declined however pt states she doesn't remember and requested another call back to explain services once again. RN made another requested via referral for a social worker consult. Other discussion related to pt's adherence with the weekly glucose sticks.  Pt states due to her obesity her fingers are very sensitive and the sticks are "painful". Note pt did agree to do the sticks weekly on the initial assessment in May however opted not to perform the glucose sticks. Pt states the last read was from the fire dept who visited and randomly completed a glucose read (171 not sure when this occurred). Pt is aware of her last A1c 8.1 reported by patient. Pt states she received the packet of information via [facility] with the related information on Diabetes and the [facility] calendar and was very appreciative. Pt states she does not qualify a free style monitoring system and she will not check her glucose weekly has agreed. RN stress the importance and risk involved if her Diabetes is not monitored (pt verbalized an understanding).   Reiterated on any needs that this RN can address at this time based upon what she would be willing to do in managing her self care. Pt states at this time she is not willing to participate by stating she "can't. Pt stats she did not wish to  completed again finger sticks, use SCATs due to the long wait for pick up due to use of incontinence pads and can't really use the bathrooms at the provider's office. All that was mention on the call last month with the social worker was discussed however pt requested again requested another consult. Another area discuss if pt limitations in the home so RN case manager discussed levels of care with possible ALF. Pt not interested and again continue to refuse participation with no additional needs to address further.  Plan: Previous goals not met due to pt's decline to participate and Dr. Addison Lank with be notify of this discipline will not be available on the care plan. No other issues to address at this time. Verified pt has information via [facility] if needed in the further.  [name], RN Care Management Coordinator [place] Office [phone]

## 2019-08-08 ENCOUNTER — Other Ambulatory Visit: Payer: Self-pay | Admitting: *Deleted

## 2019-08-08 ENCOUNTER — Encounter: Payer: Self-pay | Admitting: *Deleted

## 2019-08-08 NOTE — Patient Outreach (Signed)
[facility]) Care Management  08/08/2019  Whitney Blackburn 06-07-1955 [id number]   CSW was able to make contact with patient today to assess and assist with social work needs and services.  CSW introduced self, explained role and types of services provided through [facility] ([facility]).  Patient admitted that she remembered having worked with [facility] in the past.  Homer further explained to patient that CSW works with patient's RNCM, also with [facility], [name].  CSW then explained the reason for the call, indicating that Ms. Zigmund Daniel thought that patient would benefit from social work services and resources to assist with obtaining meals, arranging transportation to and from physician appointments and assisting patient with obtaining a bariatric wheelchair.  CSW obtained two HIPAA compliant identifiers from patient, which included patient's name and date of birth.  Once again, patient denied wanting to use any form of public transportation to get to and from her physician appointments; however, patient was agreeable to having CSW email ([email or web address] her information about Armed forces technical officer, through [employer] [place].  In addition, CSW agreed to email patient a Therapist, music for Seniors in [place].  Last, CSW encouraged patient to utilize her transportation benefit through [employer], her insurance provider, as they offer free transportation to [employer] recipients, to and from their physician appointments.  CSW provided patient with the contact information, after patient agreed to contact them today to get established in their database, as well as schedule transportation for all of her upcoming physician appointments.  CSW spoke more in-depth with patient about [employer], explaining that they are not at all similar to [employer], through [employer] [facility], as patient was definitely not interested in receiving frozen meals.  CSW went on to explain to patient that CSW can approve patient for a 28 day supply of [employer], courtesy of [facility], but that patient would be responsible for paying for any additional meals, if patient decides to continue to utilize their services.  Patient voiced understanding and was agreeable to this plan.  Patient admitted that she is currently only eating one meal per day, typically something that is unhealthy and able to be heated in the microwave, indicating that it is just too difficult for her stand on her feet for any length of time to prepare a decent meal for herself.  CSW obtained patient's food preferences, food allergies and dietary restrictions, while conversing with patient over the phone, in order to submit patient's application to [employer] for processing.  Patient is aware that the meals will be tailor-made, according to all of the above criteria, and that the meals will be delivered in a cooler, in two week increments, directly to her front door.  CSW will also email patient a list of Food Pantries in [place], [employer] in [place] in [place].  Patient reported that her sister, Marica Otter provides her with meals, several times per week, and that she orders her groceries on-line and has them delivered directly to her home.  Mrs. Grandville Silos is also able to assist patient with transportation.  Per patient's request, CSW emailed her a list of agencies that sell durable medical equipment, specifically lightweight bariatric wheelchairs, as patient is looking to buy a new wheelchair that her and Mrs. Grandville Silos are able to  fold up and transport in Mrs. Thompson's vehicle.  Patient admitted that her biggest problem is being able to find a lightweight bariatric wheelchair that is  wider than 26" in the seat, that they can lift into the vehicle.  CSW will follow-up with patient again next week, on Thursday, August 16, 2019, around 1:00pm, to ensure that patient received the list of resources emailed to her today by CSW, as well as to ensure that patient's first two weeks worth of meals were delivered to her home by [employer].  CSW was able to confirm that patient has the correct contact information for CSW, encouraging her to contact CSW if additional social work needs arise in the meantime.  [name], BSW, MSW, LCSW  Licensed Education officer, environmental [facility]  Mailing [place] [address], [place], [place] [address] Physical Address-[address], [place], [place] [address] Toll Free Main # [phone] Fax # [phone] Cell # [phone]  Office # [phone] [email or web address]

## 2019-08-09 ENCOUNTER — Ambulatory Visit: Payer: Self-pay | Admitting: *Deleted

## 2019-08-16 ENCOUNTER — Other Ambulatory Visit: Payer: Self-pay | Admitting: *Deleted

## 2019-08-16 ENCOUNTER — Encounter: Payer: Self-pay | Admitting: *Deleted

## 2019-08-16 NOTE — Patient Outreach (Signed)
[facility]) Care Management  08/16/2019  [name] [birth year] [id number]   CSW was able to make contact with patient today to follow-up regarding social work services and resources, as well as to ensure that patient received the packet of resource information mailed to her home by CSW.  Patient confirmed receipt.  CSW also wanted to confirm that patient received her first shipment (14 days worth) of meals, delivered directly to her home by [employer], courtesy of [facility].  Patient confirmed receipt of these as well, and is aware that her next shipment (14 days worth) will arrive before she runs out of her first shipment.  CSW will perform a case closure on patient, as all goals of treatment have been met from social work standpoint and no additional social work needs have been identified at this time.  CSW will notify patient's RNCM with [facility], [name] of CSW's plans to close patient's case.  CSW will fax an update to patient's Primary Care Physician, Dr. Cari Caraway to ensure that she is aware of CSW's involvement with patient's plan of care, in addition to routing a Physician Case Closure Letter to her office.    [name], BSW, MSW, LCSW  Licensed Education officer, environmental Health System  Mailing [place] [address] [place], [place] [address] Physical Address-[address], [place], [place] [address] Toll Free Main # [phone] Fax # [phone] Cell # [phone]  Office # [phone] [email or web address]

## 2020-03-31 ENCOUNTER — Other Ambulatory Visit: Payer: Medicare Other | Admitting: Orthotics

## 2020-03-31 ENCOUNTER — Ambulatory Visit: Payer: Medicare Other | Admitting: Podiatry

## 2020-03-31 ENCOUNTER — Other Ambulatory Visit: Payer: Self-pay

## 2020-03-31 DIAGNOSIS — B351 Tinea unguium: Secondary | ICD-10-CM | POA: Diagnosis not present

## 2020-03-31 DIAGNOSIS — M79674 Pain in right toe(s): Secondary | ICD-10-CM | POA: Diagnosis not present

## 2020-03-31 DIAGNOSIS — M7661 Achilles tendinitis, right leg: Secondary | ICD-10-CM | POA: Diagnosis not present

## 2020-03-31 DIAGNOSIS — M79675 Pain in left toe(s): Secondary | ICD-10-CM | POA: Diagnosis not present

## 2020-04-01 ENCOUNTER — Encounter: Payer: Self-pay | Admitting: Podiatry

## 2020-04-01 NOTE — Progress Notes (Signed)
Subjective:   Patient ID: Whitney Blackburn, female   DOB: 65 y.o.   MRN: [id number]   HPI Patient presents stating that she is had a lot of pain in the back of her right heel has extreme obesity is very difficult for her to be active and has significant severe nail disease that she cannot take care of herself   ROS      Objective:  Physical Exam  Neurovascular status unchanged from previous visit with patient found to have quite a bit of pain in the posterior lateral aspect of the right heel with inflammation of the Achilles tendon as it inserts into the back of the heel.  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  I recommended a careful steroid injection and I did explain the risk of rupture associated with it and it she will have to continue to be easy with it which she is due to her overall physical condition I did a sterile prep of the lateral side I injected the tendon 3 mg Dexasone Kenalog 5 mg Xylocaine keeping away from the center and medial side of the tendon and I debrided nailbeds 1-5 both feet with no iatrogenic bleeding.  Reappoint for routine care as needed and encouraged to call with any questions which may come up

## 2020-04-14 DIAGNOSIS — E785 Hyperlipidemia, unspecified: Secondary | ICD-10-CM | POA: Diagnosis not present

## 2020-04-14 DIAGNOSIS — E7800 Pure hypercholesterolemia, unspecified: Secondary | ICD-10-CM | POA: Diagnosis not present

## 2020-04-14 DIAGNOSIS — I10 Essential (primary) hypertension: Secondary | ICD-10-CM | POA: Diagnosis not present

## 2020-04-14 DIAGNOSIS — E119 Type 2 diabetes mellitus without complications: Secondary | ICD-10-CM | POA: Diagnosis not present

## 2020-04-14 DIAGNOSIS — E1140 Type 2 diabetes mellitus with diabetic neuropathy, unspecified: Secondary | ICD-10-CM | POA: Diagnosis not present

## 2020-04-14 DIAGNOSIS — E039 Hypothyroidism, unspecified: Secondary | ICD-10-CM | POA: Diagnosis not present

## 2020-04-14 DIAGNOSIS — M1990 Unspecified osteoarthritis, unspecified site: Secondary | ICD-10-CM | POA: Diagnosis not present

## 2020-04-14 DIAGNOSIS — K219 Gastro-esophageal reflux disease without esophagitis: Secondary | ICD-10-CM | POA: Diagnosis not present

## 2020-05-15 DIAGNOSIS — E7800 Pure hypercholesterolemia, unspecified: Secondary | ICD-10-CM | POA: Diagnosis not present

## 2020-05-15 DIAGNOSIS — M1990 Unspecified osteoarthritis, unspecified site: Secondary | ICD-10-CM | POA: Diagnosis not present

## 2020-05-15 DIAGNOSIS — E1140 Type 2 diabetes mellitus with diabetic neuropathy, unspecified: Secondary | ICD-10-CM | POA: Diagnosis not present

## 2020-05-15 DIAGNOSIS — E785 Hyperlipidemia, unspecified: Secondary | ICD-10-CM | POA: Diagnosis not present

## 2020-05-15 DIAGNOSIS — I10 Essential (primary) hypertension: Secondary | ICD-10-CM | POA: Diagnosis not present

## 2020-05-15 DIAGNOSIS — E119 Type 2 diabetes mellitus without complications: Secondary | ICD-10-CM | POA: Diagnosis not present

## 2020-05-15 DIAGNOSIS — E039 Hypothyroidism, unspecified: Secondary | ICD-10-CM | POA: Diagnosis not present

## 2020-05-15 DIAGNOSIS — K219 Gastro-esophageal reflux disease without esophagitis: Secondary | ICD-10-CM | POA: Diagnosis not present

## 2020-05-22 DIAGNOSIS — E785 Hyperlipidemia, unspecified: Secondary | ICD-10-CM | POA: Diagnosis not present

## 2020-05-22 DIAGNOSIS — I10 Essential (primary) hypertension: Secondary | ICD-10-CM | POA: Diagnosis not present

## 2020-05-22 DIAGNOSIS — E039 Hypothyroidism, unspecified: Secondary | ICD-10-CM | POA: Diagnosis not present

## 2020-05-22 DIAGNOSIS — K219 Gastro-esophageal reflux disease without esophagitis: Secondary | ICD-10-CM | POA: Diagnosis not present

## 2020-05-22 DIAGNOSIS — E1140 Type 2 diabetes mellitus with diabetic neuropathy, unspecified: Secondary | ICD-10-CM | POA: Diagnosis not present

## 2020-05-22 DIAGNOSIS — E119 Type 2 diabetes mellitus without complications: Secondary | ICD-10-CM | POA: Diagnosis not present

## 2020-05-22 DIAGNOSIS — E7800 Pure hypercholesterolemia, unspecified: Secondary | ICD-10-CM | POA: Diagnosis not present

## 2020-05-22 DIAGNOSIS — M1990 Unspecified osteoarthritis, unspecified site: Secondary | ICD-10-CM | POA: Diagnosis not present

## 2020-06-05 ENCOUNTER — Telehealth: Payer: Self-pay | Admitting: Podiatry

## 2020-06-05 NOTE — Telephone Encounter (Signed)
Patient called inquiring about Diabetic Shoes, informed patient we currently don't have anyone to check the status of her order. I will return patient call on Tuesday  once I have followed up with [facility]

## 2020-06-09 ENCOUNTER — Telehealth: Payer: Self-pay | Admitting: Podiatry

## 2020-06-09 NOTE — Telephone Encounter (Signed)
Patient called inquiring about the status of her diabetic shoes, stated they were to be sent off but hasn't heard anything regarding her shoes. Patient was initially informed 06/06/2020 that you were currently out of office and wouldn't be able to follow up until 06/10/20, Please Advise

## 2020-06-26 DIAGNOSIS — E119 Type 2 diabetes mellitus without complications: Secondary | ICD-10-CM | POA: Diagnosis not present

## 2020-06-26 DIAGNOSIS — K219 Gastro-esophageal reflux disease without esophagitis: Secondary | ICD-10-CM | POA: Diagnosis not present

## 2020-06-26 DIAGNOSIS — M1990 Unspecified osteoarthritis, unspecified site: Secondary | ICD-10-CM | POA: Diagnosis not present

## 2020-06-26 DIAGNOSIS — E039 Hypothyroidism, unspecified: Secondary | ICD-10-CM | POA: Diagnosis not present

## 2020-06-26 DIAGNOSIS — E1140 Type 2 diabetes mellitus with diabetic neuropathy, unspecified: Secondary | ICD-10-CM | POA: Diagnosis not present

## 2020-06-26 DIAGNOSIS — E785 Hyperlipidemia, unspecified: Secondary | ICD-10-CM | POA: Diagnosis not present

## 2020-06-26 DIAGNOSIS — I10 Essential (primary) hypertension: Secondary | ICD-10-CM | POA: Diagnosis not present

## 2020-06-26 DIAGNOSIS — E7800 Pure hypercholesterolemia, unspecified: Secondary | ICD-10-CM | POA: Diagnosis not present

## 2020-07-17 ENCOUNTER — Other Ambulatory Visit: Payer: Medicare Other

## 2020-07-21 ENCOUNTER — Ambulatory Visit (INDEPENDENT_AMBULATORY_CARE_PROVIDER_SITE_OTHER): Payer: Medicare Other | Admitting: Podiatry

## 2020-07-21 ENCOUNTER — Ambulatory Visit: Payer: Medicare Other | Admitting: Podiatry

## 2020-07-21 ENCOUNTER — Other Ambulatory Visit: Payer: Self-pay

## 2020-07-21 ENCOUNTER — Encounter: Payer: Self-pay | Admitting: Podiatry

## 2020-07-21 DIAGNOSIS — B351 Tinea unguium: Secondary | ICD-10-CM | POA: Diagnosis not present

## 2020-07-21 DIAGNOSIS — M7661 Achilles tendinitis, right leg: Secondary | ICD-10-CM | POA: Diagnosis not present

## 2020-07-21 DIAGNOSIS — M79674 Pain in right toe(s): Secondary | ICD-10-CM

## 2020-07-21 DIAGNOSIS — M216X1 Other acquired deformities of right foot: Secondary | ICD-10-CM | POA: Diagnosis not present

## 2020-07-21 DIAGNOSIS — E1140 Type 2 diabetes mellitus with diabetic neuropathy, unspecified: Secondary | ICD-10-CM | POA: Diagnosis not present

## 2020-07-21 DIAGNOSIS — M79675 Pain in left toe(s): Secondary | ICD-10-CM | POA: Diagnosis not present

## 2020-07-21 DIAGNOSIS — M216X2 Other acquired deformities of left foot: Secondary | ICD-10-CM | POA: Diagnosis not present

## 2020-07-21 NOTE — Progress Notes (Signed)
The patient presented to the office today to pick up diabetic shoes and 3 pair diabetic custom inserts.  1 pair of inserts were put in the shoes and the shoes were fitted to the patient. The patient states they are comfortable and free of defect. She was satisfied with the fit of the shoe. Instructions for break in and wear were dispensed. The patient signed the delivery documentation and break in instruction form.  If any concerns or questions arise, she is instructed to call the office

## 2020-07-21 NOTE — Progress Notes (Signed)
Subjective:   Patient ID: Whitney Blackburn, female   DOB: 66 y.o.   MRN: [id number]   HPI Patient presents with continued heel pain with extreme obesity and immobility which makes treating this very difficult but states its been really bothering her   ROS      Objective:  Physical Exam  Neurovascular status intact with patient found to have discomfort of the posterior lateral aspect right heel with no center medial involvement and also thickened yellow brittle nailbeds 1-5 both feet that she cannot take care of     Assessment:  Extreme obesity with Achilles tendinitis right lateral side along with mycotic nail infection 1-5 both feet pain     Plan:  Reviewed condition discussed the difficulty of this condition and explained the risk of injection but at this point we cannot immobilize she cannot do physical therapy certainly not a surgical candidate and as result I did first discussed with her other treatments and she wants an injection and is fully understanding of the risk of rupture.  I carefully injected the lateral side 3 mg dexamethasone Kenalog 5 mg Xylocaine advised on reduced activity and debrided nailbeds 1-5 both feet no iatrogenic bleeding noted

## 2020-08-05 DIAGNOSIS — E039 Hypothyroidism, unspecified: Secondary | ICD-10-CM | POA: Diagnosis not present

## 2020-08-05 DIAGNOSIS — I10 Essential (primary) hypertension: Secondary | ICD-10-CM | POA: Diagnosis not present

## 2020-08-05 DIAGNOSIS — E119 Type 2 diabetes mellitus without complications: Secondary | ICD-10-CM | POA: Diagnosis not present

## 2020-08-05 DIAGNOSIS — E1140 Type 2 diabetes mellitus with diabetic neuropathy, unspecified: Secondary | ICD-10-CM | POA: Diagnosis not present

## 2020-08-05 DIAGNOSIS — M1990 Unspecified osteoarthritis, unspecified site: Secondary | ICD-10-CM | POA: Diagnosis not present

## 2020-08-05 DIAGNOSIS — E7800 Pure hypercholesterolemia, unspecified: Secondary | ICD-10-CM | POA: Diagnosis not present

## 2020-08-05 DIAGNOSIS — E785 Hyperlipidemia, unspecified: Secondary | ICD-10-CM | POA: Diagnosis not present

## 2020-08-05 DIAGNOSIS — K219 Gastro-esophageal reflux disease without esophagitis: Secondary | ICD-10-CM | POA: Diagnosis not present

## 2020-08-26 DIAGNOSIS — Z01818 Encounter for other preprocedural examination: Secondary | ICD-10-CM | POA: Diagnosis not present

## 2020-08-26 DIAGNOSIS — H25811 Combined forms of age-related cataract, right eye: Secondary | ICD-10-CM | POA: Diagnosis not present

## 2020-09-11 DIAGNOSIS — E7800 Pure hypercholesterolemia, unspecified: Secondary | ICD-10-CM | POA: Diagnosis not present

## 2020-09-11 DIAGNOSIS — E1140 Type 2 diabetes mellitus with diabetic neuropathy, unspecified: Secondary | ICD-10-CM | POA: Diagnosis not present

## 2020-09-11 DIAGNOSIS — I10 Essential (primary) hypertension: Secondary | ICD-10-CM | POA: Diagnosis not present

## 2020-09-11 DIAGNOSIS — M1990 Unspecified osteoarthritis, unspecified site: Secondary | ICD-10-CM | POA: Diagnosis not present

## 2020-09-11 DIAGNOSIS — E039 Hypothyroidism, unspecified: Secondary | ICD-10-CM | POA: Diagnosis not present

## 2020-09-11 DIAGNOSIS — E119 Type 2 diabetes mellitus without complications: Secondary | ICD-10-CM | POA: Diagnosis not present

## 2020-09-11 DIAGNOSIS — K219 Gastro-esophageal reflux disease without esophagitis: Secondary | ICD-10-CM | POA: Diagnosis not present

## 2020-09-11 DIAGNOSIS — E785 Hyperlipidemia, unspecified: Secondary | ICD-10-CM | POA: Diagnosis not present

## 2020-09-24 DIAGNOSIS — H25811 Combined forms of age-related cataract, right eye: Secondary | ICD-10-CM | POA: Diagnosis not present

## 2020-09-24 DIAGNOSIS — H2511 Age-related nuclear cataract, right eye: Secondary | ICD-10-CM | POA: Diagnosis not present

## 2020-10-08 DIAGNOSIS — H25812 Combined forms of age-related cataract, left eye: Secondary | ICD-10-CM | POA: Diagnosis not present

## 2020-10-08 DIAGNOSIS — H2512 Age-related nuclear cataract, left eye: Secondary | ICD-10-CM | POA: Diagnosis not present

## 2020-10-14 ENCOUNTER — Emergency Department (HOSPITAL_BASED_OUTPATIENT_CLINIC_OR_DEPARTMENT_OTHER): Payer: Medicare Other | Admitting: Radiology

## 2020-10-14 ENCOUNTER — Emergency Department (HOSPITAL_BASED_OUTPATIENT_CLINIC_OR_DEPARTMENT_OTHER): Payer: Medicare Other

## 2020-10-14 ENCOUNTER — Encounter (HOSPITAL_BASED_OUTPATIENT_CLINIC_OR_DEPARTMENT_OTHER): Payer: Self-pay | Admitting: Emergency Medicine

## 2020-10-14 ENCOUNTER — Observation Stay (HOSPITAL_BASED_OUTPATIENT_CLINIC_OR_DEPARTMENT_OTHER)
Admission: EM | Admit: 2020-10-14 | Discharge: 2020-10-16 | Disposition: A | Discharge status: Home / Self Care | Payer: Medicare Other | Attending: Emergency Medicine | Admitting: Emergency Medicine

## 2020-10-14 ENCOUNTER — Other Ambulatory Visit: Payer: Self-pay

## 2020-10-14 DIAGNOSIS — S80919A Unspecified superficial injury of unspecified knee, initial encounter: Secondary | ICD-10-CM | POA: Diagnosis not present

## 2020-10-14 DIAGNOSIS — S8011XA Contusion of right lower leg, initial encounter: Principal | ICD-10-CM | POA: Insufficient documentation

## 2020-10-14 DIAGNOSIS — I10 Essential (primary) hypertension: Secondary | ICD-10-CM

## 2020-10-14 DIAGNOSIS — Y92009 Unspecified place in unspecified non-institutional (private) residence as the place of occurrence of the external cause: Secondary | ICD-10-CM | POA: Diagnosis not present

## 2020-10-14 DIAGNOSIS — E039 Hypothyroidism, unspecified: Secondary | ICD-10-CM | POA: Diagnosis not present

## 2020-10-14 DIAGNOSIS — M1711 Unilateral primary osteoarthritis, right knee: Secondary | ICD-10-CM | POA: Diagnosis not present

## 2020-10-14 DIAGNOSIS — Z7984 Long term (current) use of oral hypoglycemic drugs: Secondary | ICD-10-CM | POA: Insufficient documentation

## 2020-10-14 DIAGNOSIS — W1830XA Fall on same level, unspecified, initial encounter: Secondary | ICD-10-CM | POA: Insufficient documentation

## 2020-10-14 DIAGNOSIS — S8991XA Unspecified injury of right lower leg, initial encounter: Secondary | ICD-10-CM | POA: Diagnosis present

## 2020-10-14 DIAGNOSIS — Z79899 Other long term (current) drug therapy: Secondary | ICD-10-CM | POA: Insufficient documentation

## 2020-10-14 DIAGNOSIS — E119 Type 2 diabetes mellitus without complications: Secondary | ICD-10-CM | POA: Diagnosis not present

## 2020-10-14 DIAGNOSIS — R2689 Other abnormalities of gait and mobility: Secondary | ICD-10-CM

## 2020-10-14 DIAGNOSIS — Z85828 Personal history of other malignant neoplasm of skin: Secondary | ICD-10-CM | POA: Insufficient documentation

## 2020-10-14 DIAGNOSIS — M79604 Pain in right leg: Secondary | ICD-10-CM | POA: Diagnosis not present

## 2020-10-14 DIAGNOSIS — R52 Pain, unspecified: Secondary | ICD-10-CM | POA: Diagnosis not present

## 2020-10-14 DIAGNOSIS — W19XXXA Unspecified fall, initial encounter: Secondary | ICD-10-CM

## 2020-10-14 DIAGNOSIS — Z6841 Body Mass Index (BMI) 40.0 and over, adult: Secondary | ICD-10-CM

## 2020-10-14 DIAGNOSIS — Z20822 Contact with and (suspected) exposure to covid-19: Secondary | ICD-10-CM | POA: Insufficient documentation

## 2020-10-14 DIAGNOSIS — E162 Hypoglycemia, unspecified: Secondary | ICD-10-CM | POA: Diagnosis not present

## 2020-10-14 DIAGNOSIS — E669 Obesity, unspecified: Secondary | ICD-10-CM

## 2020-10-14 DIAGNOSIS — M79609 Pain in unspecified limb: Secondary | ICD-10-CM | POA: Diagnosis present

## 2020-10-14 DIAGNOSIS — E161 Other hypoglycemia: Secondary | ICD-10-CM | POA: Diagnosis not present

## 2020-10-14 DIAGNOSIS — M25561 Pain in right knee: Secondary | ICD-10-CM | POA: Diagnosis not present

## 2020-10-14 DIAGNOSIS — Z743 Need for continuous supervision: Secondary | ICD-10-CM | POA: Diagnosis not present

## 2020-10-14 DIAGNOSIS — E1169 Type 2 diabetes mellitus with other specified complication: Secondary | ICD-10-CM

## 2020-10-14 LAB — RESP PANEL BY RT-PCR (FLU A&B, COVID) ARPGX2
Influenza A by PCR: NEGATIVE
Influenza B by PCR: NEGATIVE
SARS Coronavirus 2 by RT PCR: NEGATIVE

## 2020-10-14 LAB — CBC WITH DIFFERENTIAL/PLATELET
Abs Immature Granulocytes: 0.06 10*3/uL (ref 0.00–0.07)
Basophils Absolute: 0.1 10*3/uL (ref 0.0–0.1)
Basophils Relative: 0 %
Eosinophils Absolute: 0.4 10*3/uL (ref 0.0–0.5)
Eosinophils Relative: 3 %
HCT: 39.1 % (ref 36.0–46.0)
Hemoglobin: 12.8 g/dL (ref 12.0–15.0)
Immature Granulocytes: 0 %
Lymphocytes Relative: 8 %
Lymphs Abs: 1.1 10*3/uL (ref 0.7–4.0)
MCH: 30.4 pg (ref 26.0–34.0)
MCHC: 32.7 g/dL (ref 30.0–36.0)
MCV: 92.9 fL (ref 80.0–100.0)
Monocytes Absolute: 0.8 10*3/uL (ref 0.1–1.0)
Monocytes Relative: 6 %
Neutro Abs: 11.5 10*3/uL — ABNORMAL HIGH (ref 1.7–7.7)
Neutrophils Relative %: 83 %
Platelets: 245 10*3/uL (ref 150–400)
RBC: 4.21 MIL/uL (ref 3.87–5.11)
RDW: 13.5 % (ref 11.5–15.5)
WBC: 13.8 10*3/uL — ABNORMAL HIGH (ref 4.0–10.5)
nRBC: 0 % (ref 0.0–0.2)

## 2020-10-14 LAB — BASIC METABOLIC PANEL
Anion gap: 11 (ref 5–15)
BUN: 20 mg/dL (ref 8–23)
CO2: 27 mmol/L (ref 22–32)
Calcium: 8.9 mg/dL (ref 8.9–10.3)
Chloride: 103 mmol/L (ref 98–111)
Creatinine, Ser: 0.91 mg/dL (ref 0.44–1.00)
GFR, Estimated: >60 mL/min (ref >60)
Glucose, Bld: 94 mg/dL (ref 70–99)
Potassium: 4.5 mmol/L (ref 3.5–5.1)
Sodium: 141 mmol/L (ref 135–145)

## 2020-10-14 MED ORDER — METOPROLOL SUCCINATE ER 25 MG PO TB24
25.0000 mg | ORAL_TABLET | Freq: Every day | ORAL | Status: DC
  Administered 2020-10-15 – 2020-10-16 (×2): 25 mg via ORAL
  Filled 2020-10-14 (×2): qty 1

## 2020-10-14 MED ORDER — METFORMIN HCL ER 500 MG PO TB24
500.0000 mg | ORAL_TABLET | Freq: Two times a day (BID) | ORAL | Status: DC
  Administered 2020-10-15 (×2): 500 mg via ORAL
  Filled 2020-10-14 (×2): qty 1

## 2020-10-14 MED ORDER — HYDROCODONE-ACETAMINOPHEN 5-325 MG PO TABS
2.0000 | ORAL_TABLET | Freq: Once | ORAL | Status: AC
Start: 2020-10-14 — End: 2020-10-14
  Administered 2020-10-14: 2 via ORAL
  Filled 2020-10-14: qty 2

## 2020-10-14 MED ORDER — HYDROCODONE-ACETAMINOPHEN 5-325 MG PO TABS
1.0000 | ORAL_TABLET | Freq: Four times a day (QID) | ORAL | Status: DC | PRN
  Administered 2020-10-14 – 2020-10-15 (×3): 2 via ORAL
  Administered 2020-10-15: 1 via ORAL
  Administered 2020-10-16 (×2): 2 via ORAL
  Administered 2020-10-16: 1 via ORAL
  Filled 2020-10-14: qty 1
  Filled 2020-10-14: qty 2
  Filled 2020-10-14: qty 1
  Filled 2020-10-14 (×4): qty 2

## 2020-10-14 MED ORDER — EZETIMIBE 10 MG PO TABS
10.0000 mg | ORAL_TABLET | Freq: Every day | ORAL | Status: DC
  Administered 2020-10-15 – 2020-10-16 (×2): 10 mg via ORAL
  Filled 2020-10-14 (×2): qty 1

## 2020-10-14 MED ORDER — SODIUM CHLORIDE 0.9% FLUSH: 3.0000 mL | INTRAVENOUS | Status: DC | PRN

## 2020-10-14 MED ORDER — HYDROCHLOROTHIAZIDE 25 MG PO TABS
25.0000 mg | ORAL_TABLET | Freq: Every day | ORAL | Status: DC
  Administered 2020-10-15 – 2020-10-16 (×2): 25 mg via ORAL
  Filled 2020-10-14 (×2): qty 1

## 2020-10-14 MED ORDER — ACETAMINOPHEN 650 MG RE SUPP: 650.0000 mg | Freq: Four times a day (QID) | RECTAL | Status: DC | PRN

## 2020-10-14 MED ORDER — KETOROLAC TROMETHAMINE 30 MG/ML IJ SOLN
30.0000 mg | Freq: Once | INTRAMUSCULAR | Status: AC
  Administered 2020-10-14: 30 mg via INTRAMUSCULAR
  Filled 2020-10-14: qty 1

## 2020-10-14 MED ORDER — ATORVASTATIN CALCIUM 40 MG PO TABS
80.0000 mg | ORAL_TABLET | Freq: Every day | ORAL | Status: DC
  Administered 2020-10-15 – 2020-10-16 (×2): 80 mg via ORAL
  Filled 2020-10-14 (×2): qty 2

## 2020-10-14 MED ORDER — LISINOPRIL 20 MG PO TABS
40.0000 mg | ORAL_TABLET | Freq: Every day | ORAL | Status: DC
  Administered 2020-10-15 – 2020-10-16 (×2): 40 mg via ORAL
  Filled 2020-10-14 (×2): qty 2

## 2020-10-14 MED ORDER — SODIUM CHLORIDE 0.9 % IV SOLN: 250.0000 mL | INTRAVENOUS | Status: DC | PRN

## 2020-10-14 MED ORDER — MELATONIN 3 MG PO TABS
3.0000 mg | ORAL_TABLET | Freq: Every day | ORAL | Status: DC
  Administered 2020-10-14 – 2020-10-16 (×3): 3 mg via ORAL
  Filled 2020-10-14 (×3): qty 1

## 2020-10-14 MED ORDER — LEVOTHYROXINE SODIUM 100 MCG PO TABS
100.0000 ug | ORAL_TABLET | Freq: Every day | ORAL | Status: DC
  Administered 2020-10-15 – 2020-10-16 (×2): 100 ug via ORAL
  Filled 2020-10-14 (×2): qty 1

## 2020-10-14 MED ORDER — HYDROMORPHONE HCL 1 MG/ML IJ SOLN: 1.0000 mg | INTRAMUSCULAR | Status: DC | PRN

## 2020-10-14 MED ORDER — ACETAMINOPHEN 325 MG PO TABS: 650.0000 mg | ORAL_TABLET | Freq: Four times a day (QID) | ORAL | Status: DC | PRN

## 2020-10-14 MED ORDER — MORPHINE SULFATE (PF) 4 MG/ML IV SOLN
4.0000 mg | Freq: Once | INTRAVENOUS | Status: AC
  Administered 2020-10-14: 4 mg via INTRAVENOUS
  Filled 2020-10-14: qty 1

## 2020-10-14 MED ORDER — SODIUM CHLORIDE 0.9% FLUSH
3.0000 mL | Freq: Two times a day (BID) | INTRAVENOUS | Status: DC
  Administered 2020-10-15: 3 mL via INTRAVENOUS

## 2020-10-14 MED ORDER — SENNOSIDES-DOCUSATE SODIUM 8.6-50 MG PO TABS: 1.0000 | ORAL_TABLET | Freq: Every evening | ORAL | Status: DC | PRN

## 2020-10-14 MED ORDER — DULOXETINE HCL 30 MG PO CPEP
30.0000 mg | ORAL_CAPSULE | Freq: Every day | ORAL | Status: DC
  Administered 2020-10-14 – 2020-10-16 (×3): 30 mg via ORAL
  Filled 2020-10-14 (×3): qty 1

## 2020-10-14 NOTE — ED Notes (Signed)
X-ray at bedside

## 2020-10-14 NOTE — ED Triage Notes (Signed)
Pt arrives to ED via [facility] with c/o right knee and leg pain after fall. Pt reports she had mechanical fall after he shoe ripped off while she was walking with her walker. Pt reports she fell forward landing on the right knee.

## 2020-10-14 NOTE — ED Provider Notes (Signed)
[facility] Provider Note   CSN: [id number] Arrival date & time: 10/14/20  1315     History Chief Complaint  Patient presents with   Nerstrand is a 65 y.o. female.  65 year old female with a history of HTN, HLD, morbid obesity, lymphedema presenting s/p mechanical fall that occurred around noon today.  Patient was ambulating with her walker when her shoe fell apart, causing her to fall on her right knee and lower leg.  Denies any other injury.  She has been able to ambulate since then but with significant pain.  Pain is primarily in her right lower leg and there is an area of bruising and swelling.  She reports history of Achilles tendinitis in the right leg and had taken Tylenol and Advil for this prior to the fall, nothing taken after the fall.  Not on any blood thinners.  The history is provided by the patient. No language interpreter was used.  Fall      Past Medical History:  Diagnosis Date   Allergy    Anemia    Arthritis    Cataracts, bilateral    Chicken pox    Depression    Eating disorder    1980's   Fainting spell 1980's   noted in 1980's   Glaucoma    Heart murmur    Hyperlipidemia    Hypertension    noted high readings in the past, per has not been to MD in 3 years   Macular degeneration    Skin rash    Ulcer 1980    Patient Active Problem List   Diagnosis Date Noted   Intractable pain 10/14/2020   Unspecified venous (peripheral) insufficiency 06/30/2011   Pain in limb 06/30/2011   HTN (hypertension) 05/10/2011   Hyperlipidemia 05/10/2011   Hypothyroid 05/10/2011   Leg swelling 05/10/2011   Hidradenitis axillaris 05/10/2011   Plaque psoriasis 05/10/2011   Fungal infection of foot 05/10/2011   Neoplasm of uncertain behavior of skin 05/10/2011   Sleep apnea 05/10/2011   Morbid obesity ([place]) 05/10/2011    Past Surgical History:  Procedure Laterality Date   retina     RETINAL DETACHMENT SURGERY  1992      OB History   No obstetric history on file.     Family History  Problem Relation Age of Onset   Crohn's disease Father    Early death Father    Hyperlipidemia Mother    Diabetes Sister    Crohn's disease Paternal Uncle    Hyperlipidemia Maternal Grandmother    Hypertension Maternal Grandmother    Hyperlipidemia Maternal Grandfather    Hypertension Maternal Grandfather     Social History   Tobacco Use   Smoking status: Never   Smokeless tobacco: Never  Vaping Use   Vaping Use: Never used  Substance Use Topics   Alcohol use: No   Drug use: No    Home Medications Prior to Admission medications   Medication Sig Start Date End Date Taking? Authorizing Provider  acetaminophen (TYLENOL) 500 MG tablet Take 500 mg by mouth every 6 (six) hours as needed.   Yes [provider]  atorvastatin (LIPITOR) 80 MG tablet Take 80 mg by mouth daily. 02/20/20  Yes [provider]  DULoxetine (CYMBALTA) 30 MG capsule Take 30 mg by mouth at bedtime. Takes '30mg'$  and '60mg'$  at night 03/09/20  Yes [provider]  ezetimibe (ZETIA) 10 MG tablet Take 10 mg by mouth daily. 02/20/20  Yes [provider]  famotidine (PEPCID) 20 MG tablet Take 20 mg by mouth as needed for heartburn or indigestion.   Yes [provider]  glipiZIDE (GLUCOTROL XL) 10 MG 24 hr tablet Take 10 mg by mouth daily with breakfast.   Yes [provider]  hydrochlorothiazide (HYDRODIURIL) 25 MG tablet Take 25 mg by mouth daily.   Yes [provider]  levothyroxine (SYNTHROID, LEVOTHROID) 100 MCG tablet Take 1 tablet (100 mcg total) by mouth daily. 05/13/11 10/14/20 Yes Midge Minium, MD  lisinopril (PRINIVIL,ZESTRIL) 40 MG tablet Take 40 mg by mouth daily.   Yes [provider]  Melatonin 3 MG TABS Take 3 mg by mouth.   Yes [provider]  metFORMIN (GLUCOPHAGE-XR) 500 MG 24 hr tablet Take 500 mg by mouth 2 (two) times daily. 2 tabs twice daily   Yes  [provider]  metoprolol succinate (TOPROL-XL) 25 MG 24 hr tablet Take 25 mg by mouth daily. 02/20/20  Yes [provider]  Multiple Vitamins-Minerals (CENTRUM SILVER 50+WOMEN PO) Take by mouth.   Yes [provider]  Potassium 99 MG TABS Take 99 mg by mouth.   Yes [provider]  Vitamin D, Ergocalciferol, (DRISDOL) 1.25 MG (50000 UNIT) CAPS capsule Take 50,000 Units by mouth every 7 (seven) days.   Yes [provider]    Allergies    Dicloxacillin and Shellfish allergy  Review of Systems   Review of Systems  Musculoskeletal:  Positive for arthralgias.   Physical Exam Updated Vital Signs BP (!) 151/133   Pulse 72   Temp 98.3 F (36.8 C)   Resp 16   Ht '5\' 7"'$  (1.702 m)   Wt (!) 167.8 kg   SpO2 100%   BMI 57.95 kg/m   Physical Exam Constitutional:      General: She is not in acute distress.    Appearance: Normal appearance. She is obese.  HENT:     Head: Normocephalic and atraumatic.  Eyes:     Extraocular Movements: Extraocular movements intact.  Cardiovascular:     Rate and Rhythm: Normal rate and regular rhythm.     Pulses: Normal pulses.     Heart sounds: Normal heart sounds. No murmur heard. Pulmonary:     Effort: Pulmonary effort is normal. No respiratory distress.     Breath sounds: Normal breath sounds.  Musculoskeletal:     Cervical back: Neck supple.     Comments: No tenderness with palpation along right knee joint.  No effusion noted.  Area of swelling and ecchymosis noted on right lower leg.  Tenderness with light palpation along right lower leg area of ecchymoses.  Pain elicited to right shin with slight movement of the leg.  Plantarflexion and dorsiflexion of right foot without pain.  Skin:    General: Skin is warm and dry.     Comments: Large area of ecchymoses noted on right lower leg.  Neurological:     Mental Status: She is alert.     Comments: Neurovascularly intact    ED Results / Procedures /  Treatments   Labs (all labs ordered are listed, but only abnormal results are displayed) Labs Reviewed  CBC WITH DIFFERENTIAL/PLATELET - Abnormal; Notable for the following components:      Result Value   WBC 13.8 (*)    Neutro Abs 11.5 (*)    All other components within normal limits  RESP PANEL BY RT-PCR (FLU A&B, COVID) [id number]  BASIC METABOLIC PANEL  EKG None  Radiology DG Tibia/Fibula Right  Result Date: 10/14/2020 CLINICAL DATA:  Post fall, now with right lower extremity pain, bruising and swelling. EXAM: RIGHT TIBIA AND FIBULA - 2 VIEW COMPARISON:  None. FINDINGS: There is a suspected at least 12.8 x 3.5 cm hematoma involving the subcutaneous tissues of the anterolateral aspect the right lower leg. No associated fracture or dislocation. Scattered adjacent dermal calcifications. No radiopaque foreign body. No subcutaneous emphysema. Limited visualization of the knee demonstrates severe tricompartmental degenerative change, worse within the medial compartment. Moderate to severe degenerative change is suspected of the ankle though suboptimally evaluated due to obliquity. IMPRESSION: 1. Suspected 12.8 cm hematoma involving the subcutaneous tissues about the anterolateral aspect of the right lower leg without associated fracture or radiopaque foreign body. 2. 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 3. Moderate to severe degenerative change of the ankle is suspected though incompletely evaluated. Electronically Signed   By: Sandi Mariscal M.D.   On: 10/14/2020 15:34   CT Tibia Fibula Right Wo Contrast  Result Date: 10/14/2020 CLINICAL DATA:  Swelling and pain.  Injury to right leg. EXAM: CT OF THE LOWER RIGHT EXTREMITY WITHOUT CONTRAST TECHNIQUE: Multidetector CT imaging of the right lower extremity was performed according to the standard protocol. COMPARISON:  X-ray right knee and tibia fibula 10/14/2020 FINDINGS: Bones/Joint/Cartilage Severe  tricompartmental degenerative changes of the right knee. Lateral subluxation of the patella in relation to the patellofemoral joint with impaction of the patella onto the and anterior right femoral condyle. No cortical erosion. No acute displaced fracture or dislocation of the bones of the leg including knee and ankle. No large joint effusion.  No lipohemarthrosis identified. Ligaments Suboptimally assessed by CT. Muscles and Tendons Diffusely atrophic. Soft tissues Slightly heterogeneous, high density, lobulated subcutaneus soft tissue lesion along the anterior and lateral soft tissues measuring approximately 10 x 4 x 14 cm in centered along the mid leg. Associated diffuse subcutaneus soft tissue edema and dermal thickening. No subcutaneus soft tissue emphysema. No definite deep fascial edema. No retained radiopaque foreign body. IMPRESSION: 1. Approximally 10 x 4 x 14 cm subcutaneus soft tissue density along the anterior and lateral mid leg likely representing a hematoma with underlying mass lesion not excluded. Associated diffuse subcutaneus soft tissue edema and dermal thickening. Superimposed infection not excluded. Limited evaluation on this noncontrast study. 2. 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 Lateral subluxation of the patella in relation to the patellofemoral joint with impaction of the patella onto the and anterior right femoral condyle. Electronically Signed   By: Iven Finn M.D.   On: 10/14/2020 18:13   DG Knee Complete 4 Views Right  Result Date: 10/14/2020 CLINICAL DATA:  Post fall, now with right knee pain. EXAM: RIGHT KNEE - COMPLETE 4+ VIEW COMPARISON:  Right tibia and fibular radiographs-earlier same day FINDINGS: No fracture or dislocation. No joint effusion or evidence of lipohemarthrosis Severe tricompartmental degenerative change of knee, worse within the medial compartment with complete joint space loss, bone-on-bone articulation, subchondral sclerosis  and osteophytosis. There is minimal spurring of the tibial spines. No evidence of chondrocalcinosis. No radiopaque foreign body. IMPRESSION: 1. No fracture or dislocation. 2. 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 Electronically Signed   By: Sandi Mariscal M.D.   On: 10/14/2020 15:31    Procedures Procedures   Medications Ordered in ED Medications  ketorolac (TORADOL) 30 MG/ML injection 30 mg (30 mg Intramuscular Given 10/14/20 1526)  HYDROcodone-acetaminophen (NORCO/VICODIN) 5-325 MG per tablet 2 tablet (2 tablets Oral Given 10/14/20 1741)    ED Course  I have reviewed the triage vital signs and the nursing notes.  Pertinent labs & imaging results that were available during my care of the patient were reviewed by me and considered in my medical decision making (see chart for details).    MDM Rules/Calculators/A&P                         65 year old female with a history of morbid obesity, lymphedema of the left leg presenting s/p mechanical fall with resulting injury to right knee and right lower leg.  She has significant swelling and ecchymosis to the right lower leg.  We will give IM Toradol for pain.  Plain films of the knee and tib/fib pending.  XR revealing findings consistent with 12.8 cm hematoma but no evidence of fracture.  Low suspicion for compartment syndrome at this time.  Patient lives alone and ambulates with a walker at baseline and states she will not be able to get around at home due to the pain.  She does not have a wheelchair at home and will require a bariatric wheelchair. Will place [facility] consult to attempt to get her bariatric wheelchair.  Received call from social worker Rosendo Gros that bariatric wheelchair can be either delivered to the ED or to her home tomorrow morning.  Upon further discussion, patient and sister expressing concerns that she will not be able to get around the house with amount of pain that she is having  even if she has a bariatric wheelchair.  Reports no relief with IM Toradol, so we will order a dose of Norco.  May benefit from SNF placement, will order PT.  We will also order CT scan to rule out missed fracture.  Labs and COVID swab ordered in the event she will need admission/placement.  Received update from social worker that PT is unable to come out to [facility] ED.  Options would be to transfer to hospital for PT evaluation or discharge home with home health services and bariatric wheelchair.  Discussed options with patient and sister.  She reports mild pain relief with Norco but is still having significant pain and is unable to stand due to pain.  They would prefer to be transferred to [facility].  Will place consult to hospitalist.  Discussed case with Dr. Myna Hidalgo, agrees to accept patient for admission for pain control and assessment for possible SNF placement.     Durable Medical Equipment  (From admission, onward)           Start     Ordered   10/14/20 1813  For home use only DME standard manual wheelchair with seat cushion  Once       Comments: Patient suffers from morbid obesity, lymphedema which impairs their ability to perform daily activities like bathing, dressing, and toileting in the home.  A walker will not resolve issue with performing activities of daily living. A wheelchair will allow patient to safely perform daily activities. Patient can safely propel the wheelchair in the home or has a caregiver who can provide assistance. Length of need Lifetime. Accessories: elevating leg rests (ELRs), wheel locks, extensions and anti-tippers.   10/14/20 1815              Durable Medical Equipment  (From admission, onward)           Start  Ordered   10/14/20 1813  For home use only DME standard manual wheelchair with seat cushion  Once       Comments: Patient suffers from morbid obesity, lymphedema which impairs their ability to perform daily activities  like bathing, dressing, and toileting in the home.  A walker will not resolve issue with performing activities of daily living. A wheelchair will allow patient to safely perform daily activities. Patient can safely propel the wheelchair in the home or has a caregiver who can provide assistance. Length of need Lifetime. Accessories: elevating leg rests (ELRs), wheel locks, extensions and anti-tippers.   10/14/20 1815              Final Clinical Impression(s) / ED Diagnoses Final diagnoses:  None    Rx / DC Orders ED Discharge Orders     None        Zola Button, MD 10/14/20 Colbert Coyer    Elnora Morrison, MD 10/15/20 0005

## 2020-10-14 NOTE — H&P (Signed)
History and Physical    Whitney Blackburn [id number] DOB: 1955/07/11 DOA: 10/14/2020  PCP: Cari Caraway, MD   Patient coming from: Home via [facility]  Chief Complaint: Pain right leg and not able to walk  HPI: Whitney Blackburn is a 65 y.o. female with medical history significant for DMT2, HTN, HLD, hypothyroidism, morbid obesity who was at home today when she had a fall and landed on her right side.  She was using her walker and started to ambulate fell apart she states causing her to lose her balance and she fell over onto her right side.  Fall occurred around noon when she was trying to go to a doctor's appointment follow-up of recent cataract surgery that her sister was going to take her to.  She was not able to stand up and bear weight on her right leg and was taken to the emergency room for evaluation.  He has severe pain that she rates as 10 out of 10 if she moves her leg or tries to stand on it.  She is not able to ambulate at this time.  There is swelling and bruising of the anterior lateral aspect of her right leg.  There is no sign of compartment syndrome per the ER physician.  X-ray showed no fracture of her leg or knee.  She does have severe osteoarthritis of her right knee.  She took Tylenol and Advil prior to coming to the emergency room for the pain but it did not help.  CT scan of the leg was obtained and there was no fracture noted but there was a large subcutaneous hematoma.  With her intractable leg pain and concern for possible need for rehab hospital service was asked to evaluate and manage patient overnight and arrange for physical therapy evaluation.  Patient been hemodynamically stable.  She did not hit her head and had no loss of consciousness.  She denies any chest pain, palpitations or shortness of breath Denies tobacco alcohol or illicit drug use.  Review of Systems:  General: Denies fever, chills, weight loss, night sweats.  Denies dizziness.  Denies change in  appetite HENT: Denies head trauma, headache, denies change in hearing, tinnitus.  Denies nasal congestion. Denies sore throat Eyes: Denies blurry vision, pain in eye, drainage.  Denies discoloration of eyes. Neck: Denies pain.  Denies swelling.  Denies pain with movement. Cardiovascular: Denies chest pain, palpitations. Denies orthopnea. Reports chronic edema of legs-left more than right Respiratory: Denies shortness of breath, cough. Denies wheezing.  Denies sputum production Gastrointestinal: Denies abdominal pain, swelling. Denies nausea, vomiting, diarrhea.  Denies melena.  Denies hematemesis. Musculoskeletal: Reports pain right leg and unable to stand/ambulate.  Genitourinary: Denies pelvic pain.  Denies urinary frequency or hesitancy.  Denies dysuria.  Skin: Denies rash.  Denies petechiae, purpura, ecchymosis. Neurological: Denies syncope.  Denies seizure activity.  Denies paresthesia. Denies slurred speech, drooping face.  Psychiatric: Denies depression, anxiety. Denies hallucinations.  Past Medical History:  Diagnosis Date   Allergy    Anemia    Arthritis    Cataracts, bilateral    Chicken pox    Depression    Eating disorder    1980's   Fainting spell 1980's   noted in 1980's   Glaucoma    Heart murmur    Hyperlipidemia    Hypertension    noted high readings in the past, per has not been to MD in 3 years   Macular degeneration    Skin rash    Ulcer  1980    Past Surgical History:  Procedure Laterality Date   retina     RETINAL DETACHMENT SURGERY  1992    Social History  reports that she has never smoked. She has never used smokeless tobacco. She reports that she does not drink alcohol and does not use drugs.  Allergies  Allergen Reactions   Dicloxacillin    Shellfish Allergy     Family History  Problem Relation Age of Onset   Crohn's disease Father    Early death Father    Hyperlipidemia Mother    Diabetes Sister    Crohn's disease Paternal Uncle     Hyperlipidemia Maternal Grandmother    Hypertension Maternal Grandmother    Hyperlipidemia Maternal Grandfather    Hypertension Maternal Grandfather      Prior to Admission medications   Medication Sig Start Date End Date Taking? Authorizing Provider  acetaminophen (TYLENOL) 500 MG tablet Take 500 mg by mouth every 6 (six) hours as needed.   Yes [provider]  atorvastatin (LIPITOR) 80 MG tablet Take 80 mg by mouth daily. 02/20/20  Yes [provider]  DULoxetine (CYMBALTA) 30 MG capsule Take 30 mg by mouth at bedtime. Takes '30mg'$  and '60mg'$  at night 03/09/20  Yes [provider]  ezetimibe (ZETIA) 10 MG tablet Take 10 mg by mouth daily. 02/20/20  Yes [provider]  famotidine (PEPCID) 20 MG tablet Take 20 mg by mouth as needed for heartburn or indigestion.   Yes [provider]  glipiZIDE (GLUCOTROL XL) 10 MG 24 hr tablet Take 10 mg by mouth daily with breakfast.   Yes [provider]  hydrochlorothiazide (HYDRODIURIL) 25 MG tablet Take 25 mg by mouth daily.   Yes [provider]  levothyroxine (SYNTHROID, LEVOTHROID) 100 MCG tablet Take 1 tablet (100 mcg total) by mouth daily. 05/13/11 10/14/20 Yes Midge Minium, MD  lisinopril (PRINIVIL,ZESTRIL) 40 MG tablet Take 40 mg by mouth daily.   Yes [provider]  Melatonin 3 MG TABS Take 3 mg by mouth.   Yes [provider]  metFORMIN (GLUCOPHAGE-XR) 500 MG 24 hr tablet Take 500 mg by mouth 2 (two) times daily. 2 tabs twice daily   Yes [provider]  metoprolol succinate (TOPROL-XL) 25 MG 24 hr tablet Take 25 mg by mouth daily. 02/20/20  Yes [provider]  Multiple Vitamins-Minerals (CENTRUM SILVER 50+WOMEN PO) Take by mouth.   Yes [provider]  Potassium 99 MG TABS Take 99 mg by mouth.   Yes [provider]  Vitamin D, Ergocalciferol, (DRISDOL) 1.25 MG (50000 UNIT) CAPS capsule Take 50,000 Units by mouth every 7 (seven) days.    Yes [provider]    Physical Exam: Vitals:   10/14/20 1645 10/14/20 1755 10/14/20 1900 10/14/20 2140  BP: 116/89 (!) 138/100 (!) 151/133 (!) 153/58  Pulse: 72 73 72 69  Resp: '16 16 16 20  '$ Temp:    97.7 F (36.5 C)  TempSrc:    Oral  SpO2: 99% 98% 100% 100%  Weight:    (!) 169.2 kg  Height:        Constitutional: NAD, calm, comfortable Vitals:   10/14/20 1645 10/14/20 1755 10/14/20 1900 10/14/20 2140  BP: 116/89 (!) 138/100 (!) 151/133 (!) 153/58  Pulse: 72 73 72 69  Resp: '16 16 16 20  '$ Temp:    97.7 F (36.5 C)  TempSrc:    Oral  SpO2: 99% 98% 100% 100%  Weight:    Marland Kitchen)  169.2 kg  Height:       General: WDWN, Alert and oriented x3.  Eyes: EOMI, PERRL, conjunctivae normal.  Sclera nonicteric HENT:  [place]/AT, external ears normal.  Nares patent without epistasis.  Mucous membranes are moist.  Neck: Soft, normal range of motion, supple, no masses, Trachea midline Respiratory: clear to auscultation bilaterally, no wheezing, no crackles. Normal respiratory effort. No accessory muscle use.  Cardiovascular: Regular rate and rhythm, no murmurs / rubs / gallops. Chronic edema of legs bilaterally  Abdomen: Soft, no tenderness, no rebound or guarding.  No masses palpated. Morbid obesity. Bowel sounds normoactive Musculoskeletal: FROM upper extremities. Right lower leg with swelling and large contusion anterior lateral leg. Right leg tender to palpation. Left leg with chronic lymphedema. no cyanosis. No joint deformity upper and lower extremities. Normal muscle tone.  Skin: Warm, dry, intact no rashes, lesions, ulcers. No induration Neurologic: CN 2-12 grossly intact.  Normal speech.  Sensation intact, Grip strength equal and symmetric   Psychiatric: Normal judgment and insight.  Normal mood.   Labs on Admission: I have personally reviewed following labs and imaging studies  CBC: Recent Labs  Lab 10/14/20 1754  WBC 13.8*  NEUTROABS 11.5*  HGB 12.8  HCT 39.1  MCV 92.9   PLT 99991111    Basic Metabolic Panel: Recent Labs  Lab 10/14/20 1754  NA 141  K 4.5  CL 103  CO2 27  GLUCOSE 94  BUN 20  CREATININE 0.91  CALCIUM 8.9    GFR: Estimated Creatinine Clearance: 100.4 mL/min (by C-G formula based on SCr of 0.91 mg/dL).  Liver Function Tests: No results for input(s): AST, ALT, ALKPHOS, BILITOT, PROT, ALBUMIN in the last 168 hours.  Urine analysis: No results found for: COLORURINE, APPEARANCEUR, [place], [place], GLUCOSEU, HGBUR, BILIRUBINUR, KETONESUR, PROTEINUR, UROBILINOGEN, NITRITE, LEUKOCYTESUR  Radiological Exams on Admission: DG Tibia/Fibula Right  Result Date: 10/14/2020 CLINICAL DATA:  Post fall, now with right lower extremity pain, bruising and swelling. EXAM: RIGHT TIBIA AND FIBULA - 2 VIEW COMPARISON:  None. FINDINGS: There is a suspected at least 12.8 x 3.5 cm hematoma involving the subcutaneous tissues of the anterolateral aspect the right lower leg. No associated fracture or dislocation. Scattered adjacent dermal calcifications. No radiopaque foreign body. No subcutaneous emphysema. Limited visualization of the knee demonstrates severe tricompartmental degenerative change, worse within the medial compartment. Moderate to severe degenerative change is suspected of the ankle though suboptimally evaluated due to obliquity. IMPRESSION: 1. Suspected 12.8 cm hematoma involving the subcutaneous tissues about the anterolateral aspect of the right lower leg without associated fracture or radiopaque foreign body. 2. 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 3. Moderate to severe degenerative change of the ankle is suspected though incompletely evaluated. Electronically Signed   By: Sandi Mariscal M.D.   On: 10/14/2020 15:34   CT Tibia Fibula Right Wo Contrast  Result Date: 10/14/2020 CLINICAL DATA:  Swelling and pain.  Injury to right leg. EXAM: CT OF THE LOWER RIGHT EXTREMITY WITHOUT CONTRAST TECHNIQUE:  Multidetector CT imaging of the right lower extremity was performed according to the standard protocol. 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 Lateral subluxation of the patella in relation to the patellofemoral joint with impaction of the patella onto the and anterior right femoral condyle. No cortical erosion. No acute displaced fracture or dislocation of the bones of the leg including knee and ankle. No large joint effusion.  No lipohemarthrosis identified. Ligaments Suboptimally assessed by CT. Muscles and Tendons Diffusely atrophic. Soft tissues Slightly heterogeneous, high density, lobulated subcutaneus soft tissue lesion along the anterior and lateral soft tissues measuring approximately 10 x 4 x 14 cm in centered along the mid leg. Associated diffuse subcutaneus soft tissue edema and dermal thickening. No subcutaneus soft tissue emphysema. No definite deep fascial edema. No retained radiopaque foreign body. IMPRESSION: 1. Approximally 10 x 4 x 14 cm subcutaneus soft tissue density along the anterior and lateral mid leg likely representing a hematoma with underlying mass lesion not excluded. Associated diffuse subcutaneus soft tissue edema and dermal thickening. Superimposed infection not excluded. Limited evaluation on this noncontrast study. 2. 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 Lateral subluxation of the patella in relation to the patellofemoral joint with impaction of the patella onto the and anterior right femoral condyle. Electronically Signed   By: Iven Finn M.D.   On: 10/14/2020 18:13   DG Knee Complete 4 Views Right  Result Date: 10/14/2020 CLINICAL DATA:  Post fall, now with right knee pain. EXAM: RIGHT KNEE - COMPLETE 4+ VIEW COMPARISON:  Right tibia and fibular radiographs-earlier same day FINDINGS: No fracture or dislocation. No joint effusion  or evidence of lipohemarthrosis Severe tricompartmental degenerative change of knee, worse within the medial compartment with complete joint space loss, bone-on-bone articulation, subchondral sclerosis and osteophytosis. There is minimal spurring of the tibial spines. No evidence of chondrocalcinosis. No radiopaque foreign body. IMPRESSION: 1. No fracture or dislocation. 2. 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 Electronically Signed   By: Sandi Mariscal M.D.   On: 10/14/2020 15:31     Assessment/Plan Active Problems:   Pain in limb Ms. Frederic is placed on Med Surg for observation.  Has severe pain in right leg and not able to ambulate or bear weight. Had fall at home Pain control provided.  Consult PT in am. Consult case management to help arrange for home health vs rehab placement if it is needed. Pt and her family concerned may need rehab.     Intractable pain Pain control provided. Percocet for moderate pain, dilaudid for severe pain overnight.     Unable to bear weight Up with assistance only. Fall precautions ordered. Consult PT in am    Diabetes mellitus type 2 in obese Stable. Continue metformin    Essential hypertension Continue Lisinopril , HCTZ, metoprolol. Monitor BP    Hypothyroid Continue levothyroxine.     Morbid obesity with BMI of 50.0-59.9, adult    Fall at home, initial encounter     DVT prophylaxis: TED hose. No anticoagulation with acute hematoma of leg after fall  Code Status:   Full Code  Family Communication:  Diagnosis and plan discussed with patient.  She verbalized understanding and agrees with plan.  Further recommendations to follow as clinical indicated Disposition Plan:   Patient is from:  Home  Anticipated DC to:  Home with home health versus rehab, to be determined  Anticipated DC date:  Anticipate less than 2 midnight stay.  Depending on physical therapy evaluation and patient's needs it may  be longer  Admission status:  Observation   Eben Burow MD [facility]  How to contact the [facility] Attending or Consulting provider [place] or covering provider during after hours [place], for this patient?   Check the care team in [facility] and look for a) attending/consulting [facility] provider listed and b) the [facility] team  listed Log into [email or web address] and use [place]'s universal password to access. If you do not have the password, please contact the hospital operator. Locate the [facility] provider you are looking for under [facility] and page to a number that you can be directly reached. If you still have difficulty reaching the provider, please page the [facility] (Director on Call) for the Hospitalists listed on amion for assistance.  10/14/2020, 10:17 PM

## 2020-10-14 NOTE — ED Notes (Signed)
Family at bedside. 

## 2020-10-15 ENCOUNTER — Observation Stay (HOSPITAL_BASED_OUTPATIENT_CLINIC_OR_DEPARTMENT_OTHER): Payer: Medicare Other

## 2020-10-15 DIAGNOSIS — R609 Edema, unspecified: Secondary | ICD-10-CM

## 2020-10-15 DIAGNOSIS — W19XXXA Unspecified fall, initial encounter: Secondary | ICD-10-CM | POA: Diagnosis not present

## 2020-10-15 DIAGNOSIS — L538 Other specified erythematous conditions: Secondary | ICD-10-CM | POA: Diagnosis not present

## 2020-10-15 DIAGNOSIS — M79604 Pain in right leg: Secondary | ICD-10-CM | POA: Diagnosis not present

## 2020-10-15 DIAGNOSIS — Y92009 Unspecified place in unspecified non-institutional (private) residence as the place of occurrence of the external cause: Secondary | ICD-10-CM | POA: Diagnosis not present

## 2020-10-15 DIAGNOSIS — E1169 Type 2 diabetes mellitus with other specified complication: Secondary | ICD-10-CM | POA: Diagnosis not present

## 2020-10-15 DIAGNOSIS — I10 Essential (primary) hypertension: Secondary | ICD-10-CM

## 2020-10-15 DIAGNOSIS — E039 Hypothyroidism, unspecified: Secondary | ICD-10-CM

## 2020-10-15 DIAGNOSIS — S8011XA Contusion of right lower leg, initial encounter: Secondary | ICD-10-CM | POA: Diagnosis not present

## 2020-10-15 DIAGNOSIS — E669 Obesity, unspecified: Secondary | ICD-10-CM

## 2020-10-15 LAB — GLUCOSE, CAPILLARY: Glucose-Capillary: 111 mg/dL — ABNORMAL HIGH (ref 70–99)

## 2020-10-15 LAB — BASIC METABOLIC PANEL
Anion gap: 6 (ref 5–15)
BUN: 25 mg/dL — ABNORMAL HIGH (ref 8–23)
CO2: 30 mmol/L (ref 22–32)
Calcium: 8.8 mg/dL — ABNORMAL LOW (ref 8.9–10.3)
Chloride: 106 mmol/L (ref 98–111)
Creatinine, Ser: 0.99 mg/dL (ref 0.44–1.00)
GFR, Estimated: >60 mL/min (ref >60)
Glucose, Bld: 87 mg/dL (ref 70–99)
Potassium: 4.3 mmol/L (ref 3.5–5.1)
Sodium: 142 mmol/L (ref 135–145)

## 2020-10-15 LAB — HIV ANTIBODY (ROUTINE TESTING W REFLEX): HIV Screen 4th Generation wRfx: NONREACTIVE

## 2020-10-15 MED ORDER — INSULIN ASPART 100 UNIT/ML IJ SOLN: 0.0000 [IU] | Freq: Three times a day (TID) | INTRAMUSCULAR | Status: DC

## 2020-10-15 NOTE — Evaluation (Signed)
Physical Therapy Evaluation Patient Details Name: Whitney Blackburn MRN: [id number] DOB: 11/11/1955 Today's Date: 10/15/2020   History of Present Illness  Whitney Blackburn is a 65 y.o. female who presents due to fall at home and landed on R side. X-ray showed no fracture of her leg or knee; swelling and bruising of anterior lateral R leg, no sign of compartment syndrome. CT negative for fracture, positive for large subcutaneous hematoma. PMH: diabetes, HTN, HLD, hypothyroidism, morbid obesity   Clinical Impression  Pt admitted with above diagnosis. Pt reports ambulating minimally around condo with bariatric RW, ind with self care tasks, sister provides transportation, has groceries delivered to home. Pt currently very anxious regarding mobility, yells out with initial STS transfer, insistent that 2 people assist with transfer due to fear of falling, attempted to pivot to recliner with heavy Vcs and education regarding mobility and sequencing but unsuccessful. Pt able to stand with RW while linen quickly changed; tolerates static standing for up to 1 minute max. Assisted pt back to supine, continued education on pain in RLE, therapeutic process, time OOB with guarded carryover. RN aware of pt's anxiousness/fear of falling. Recommending SNF prior to return home alone to progress back to baseline, ambulation with bariatric RW. Pt currently with functional limitations due to the deficits listed below (see PT Problem List). Pt will benefit from skilled PT to increase their independence and safety with mobility to allow discharge to the venue listed below.       Follow Up Recommendations SNF    Equipment Recommendations  Other (comment) (defer to next venue)    Recommendations for Other Services       Precautions / Restrictions Precautions Precautions: Fall Restrictions Weight Bearing Restrictions: No      Mobility  Bed Mobility Overal bed mobility: Needs Assistance Bed Mobility: Supine to  Sit  Supine to sit: Min assist;HOB elevated  General bed mobility comments: min A to upright trunk into sitting from elevated HOB and scoot out to EOB with use of bed pad    Transfers Overall transfer level: Needs assistance Equipment used: Rolling walker (2 wheeled) Transfers: Sit to/from Stand Sit to Stand: Mod assist;+2 physical assistance;+2 safety/equipment  General transfer comment: mod A +2 to power to stand using rocking momentum, blocking bil feet from sliding, bracing RW, heavy VCs for hand placement and glute activation for upright posture; completes 4 reps of STS, unable to complete pivot to recliner despite multiple attempts due to anxiety  Ambulation/Gait  General Gait Details: unable due to anxiety  Stairs            Wheelchair Mobility    Modified Rankin (Stroke Patients Only)       Balance Overall balance assessment: Needs assistance   Sitting balance-Leahy Scale: Good Sitting balance - Comments: seated EOB   Standing balance support: During functional activity;Bilateral upper extremity supported Standing balance-Leahy Scale: Poor Standing balance comment: mod A with RW       Pertinent Vitals/Pain Pain Assessment: Faces Faces Pain Scale: Hurts even more Pain Location: R anterior lower leg Pain Descriptors / Indicators: Grimacing;Guarding;Moaning Pain Intervention(s): Limited activity within patient's tolerance;Monitored during session    Home Living Family/patient expects to be discharged to:: Private residence Living Arrangements: Alone Available Help at Discharge: Family;Available PRN/intermittently Type of Home: Other(Comment) (condo) Home Access: Ramped entrance     Home Layout: One level Home Equipment: Walker - 2 wheels (bariatric RW)      Prior Function Level of Independence: Independent with assistive device(s)  Comments: Pt reports independent with bariatric RW in the home and self care tasks, gets groceries delivered to home,  sister provides transportation. Pt reports cataracts are limiting her ambulation due to low vision, doesn't ambulate in community     Hand Dominance        Extremity/Trunk Assessment   Upper Extremity Assessment Upper Extremity Assessment: Overall WFL for tasks assessed    Lower Extremity Assessment Lower Extremity Assessment: Generalized weakness;RLE deficits/detail RLE Deficits / Details: anterior lower leg hematoma noted; ankle, toe, knee and hip AROM WNL       Communication   Communication: No difficulties  Cognition Arousal/Alertness: Awake/alert Behavior During Therapy: Anxious Overall Cognitive Status: Within Functional Limits for tasks assessed  General Comments: Pt extremely anxious regarding mobility, requires +2 assist with heavy education prior to mobilizing and VCs during mobility to ease anxiousness with fair carryover- RN aware      General Comments      Exercises     Assessment/Plan    PT Assessment Patient needs continued PT services  PT Problem List Decreased strength;Decreased activity tolerance;Decreased balance;Decreased mobility;Decreased knowledge of use of DME;Decreased safety awareness;Obesity;Decreased skin integrity;Pain       PT Treatment Interventions DME instruction;Gait training;Functional mobility training;Therapeutic activities;Therapeutic exercise;Balance training;Cognitive remediation;Patient/family education    PT Goals (Current goals can be found in the Care Plan section)  Acute Rehab PT Goals Patient Stated Goal: "I want my leg to stop hurting before we move" PT Goal Formulation: With patient Time For Goal Achievement: 10/29/20 Potential to Achieve Goals: Good    Frequency Min 2X/week   Barriers to discharge        Co-evaluation               AM-PAC PT "6 Clicks" Mobility  Outcome Measure Help needed turning from your back to your side while in a flat bed without using bedrails?: A Lot Help needed moving from  lying on your back to sitting on the side of a flat bed without using bedrails?: A Lot Help needed moving to and from a bed to a chair (including a wheelchair)?: Total Help needed standing up from a chair using your arms (e.g., wheelchair or bedside chair)?: Total Help needed to walk in hospital room?: Total Help needed climbing 3-5 steps with a railing? : Total 6 Click Score: 8    End of Session Equipment Utilized During Treatment: Gait belt Activity Tolerance: Patient tolerated treatment well;Patient limited by pain;Other (comment) (limited by fear of falling) Patient left: in bed;with call bell/phone within reach Nurse Communication: Mobility status;Other (comment) (anxiety/fear of falling) PT Visit Diagnosis: Other abnormalities of gait and mobility (R26.89);Muscle weakness (generalized) (M62.81);Pain Pain - Right/Left: Right Pain - part of body: Leg    Time: [id number] PT Time Calculation (min) (ACUTE ONLY): 51 min   Charges:   PT Evaluation $PT Eval Low Complexity: 1 Low PT Treatments $Therapeutic Activity: 23-37 mins         Tori Esabella Stockinger PT, DPT 10/15/20, 10:34 AM

## 2020-10-15 NOTE — Progress Notes (Signed)
Right lower extremity venous duplex completed. Refer to "CV Proc" under chart review to view preliminary results.  10/15/2020 4:01 PM Kelby Aline., MHA, RVT, RDCS, RDMS

## 2020-10-15 NOTE — Progress Notes (Signed)
The patient started developing a slightly reddened area on the inside of right foot. The area is warm to touch and was marked with a pen to monitor.

## 2020-10-15 NOTE — Progress Notes (Signed)
[facility]  PROGRESS NOTE  Whitney Blackburn [id number] DOB: 1955-04-29 DOA: 10/14/2020 PCP: Cari Caraway, MD   Brief HPI:   65 year old female with history of diabetes mellitus type 2, hypertension, hyperlipidemia, hypothyroidism, morbid obesity who fell at home landed on her right side.  Uses walker and started to ambulate, fell causing her to lose her balance on the right side.  X-ray of the leg showed no fracture.  CT scan of the leg was obtained which showed no fracture but large subcutaneous hematoma.    Subjective   Patient seen, complains of swelling in the right swelling/pain but no pain.   Assessment/Plan:    S/p fall, right lower extremity edema/hematoma -Patient developed lower extremity swelling and hematoma after fall -Continue Vicodin as needed for pain, also started on Dilaudid 1 mg IV every 3 hours as needed for severe pain -Right lower extremity venous duplex obtained today was negative for DVT -We will consult orthopedics for further evaluation and recommendation -No signs and symptoms of compartment syndrome  Diabetes mellitus type 2 -We will hold metformin -Start sliding scale insulin with NovoLog  Hypertension -Continue lisinopril, HCTZ, metoprolol -Blood pressure is stable  Hypothyroidism -Continue Synthroid   Scheduled medications:    atorvastatin  80 mg Oral Daily   DULoxetine  30 mg Oral QHS   ezetimibe  10 mg Oral Daily   hydrochlorothiazide  25 mg Oral Daily   levothyroxine  100 mcg Oral Q0600   lisinopril  40 mg Oral Daily   melatonin  3 mg Oral QHS   metFORMIN  500 mg Oral BID WC   metoprolol succinate  25 mg Oral Daily   sodium chloride flush  3 mL Intravenous Q12H         Data Reviewed:   CBG:  No results for input(s): GLUCAP in the last 168 hours.  SpO2: 98 %    Vitals:   10/15/20 0048 10/15/20 0446 10/15/20 0955 10/15/20 1351  BP: (!) 128/48 (!) 138/48 (!) 157/83 (!) 149/76  Pulse: 83 71 96 84  Resp: '20 20  20 18  '$ Temp: 97.7 F (36.5 C) 97.9 F (36.6 C) 98.4 F (36.9 C) 98.2 F (36.8 C)  TempSrc: Oral Oral Oral Oral  SpO2: 97% 100%  98%  Weight:      Height:         Intake/Output Summary (Last 24 hours) at 10/15/2020 1833 Last data filed at 10/15/2020 1522 Gross per 24 hour  Intake 946 ml  Output 450 ml  Net 496 ml    No intake/output data recorded.  Filed Weights   10/14/20 1323 10/14/20 2140  Weight: (!) 167.8 kg (!) 169.2 kg    CBC:  Recent Labs  Lab 10/14/20 1754  WBC 13.8*  HGB 12.8  HCT 39.1  PLT 245  MCV 92.9  MCH 30.4  MCHC 32.7  RDW 13.5  LYMPHSABS 1.1  MONOABS 0.8  EOSABS 0.4  BASOSABS 0.1    Complete metabolic panel:  Recent Labs  Lab 10/14/20 1754 10/15/20 0544  NA 141 142  K 4.5 4.3  CL 103 106  CO2 27 30  GLUCOSE 94 87  BUN 20 25*  CREATININE 0.91 0.99  CALCIUM 8.9 8.8*    No results for input(s): LIPASE, AMYLASE in the last 168 hours.  Recent Labs  Lab 10/14/20 1744  [place]    ------------------------------------------------------------------------------------------------------------------ No results for input(s): CHOL, HDL, LDLCALC, TRIG, CHOLHDL, LDLDIRECT in the last 72 hours.  Lab Results  Component  Value Date   HGBA1C 5.6 05/10/2011   ------------------------------------------------------------------------------------------------------------------ No results for input(s): TSH, T4TOTAL, T3FREE, THYROIDAB in the last 72 hours.  Invalid input(s): FREET3 ------------------------------------------------------------------------------------------------------------------ No results for input(s): VITAMINB12, FOLATE, FERRITIN, TIBC, IRON, RETICCTPCT in the last 72 hours.  Coagulation profile No results for input(s): INR, PROTIME in the last 168 hours. No results for input(s): DDIMER in the last 72 hours.  Cardiac Enzymes No results for input(s): CKTOTAL, CKMB, CKMBINDEX, TROPONINI in the last 168  hours.  ------------------------------------------------------------------------------------------------------------------ No results found for: BNP   Antibiotics: Anti-infectives (From admission, onward)    None        Radiology Reports  DG Tibia/Fibula Right  Result Date: 10/14/2020 CLINICAL DATA:  Post fall, now with right lower extremity pain, bruising and swelling. EXAM: RIGHT TIBIA AND FIBULA - 2 VIEW COMPARISON:  None. FINDINGS: There is a suspected at least 12.8 x 3.5 cm hematoma involving the subcutaneous tissues of the anterolateral aspect the right lower leg. No associated fracture or dislocation. Scattered adjacent dermal calcifications. No radiopaque foreign body. No subcutaneous emphysema. Limited visualization of the knee demonstrates severe tricompartmental degenerative change, worse within the medial compartment. Moderate to severe degenerative change is suspected of the ankle though suboptimally evaluated due to obliquity. IMPRESSION: 1. Suspected 12.8 cm hematoma involving the subcutaneous tissues about the anterolateral aspect of the right lower leg without associated fracture or radiopaque foreign body. 2. 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 3. Moderate to severe degenerative change of the ankle is suspected though incompletely evaluated. Electronically Signed   By: Sandi Mariscal M.D.   On: 10/14/2020 15:34   CT Tibia Fibula Right Wo Contrast  Result Date: 10/14/2020 CLINICAL DATA:  Swelling and pain.  Injury to right leg. EXAM: CT OF THE LOWER RIGHT EXTREMITY WITHOUT CONTRAST TECHNIQUE: Multidetector CT imaging of the right lower extremity was performed according to the standard protocol. 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 Lateral subluxation of the patella in relation to the patellofemoral joint with  impaction of the patella onto the and anterior right femoral condyle. No cortical erosion. No acute displaced fracture or dislocation of the bones of the leg including knee and ankle. No large joint effusion.  No lipohemarthrosis identified. Ligaments Suboptimally assessed by CT. Muscles and Tendons Diffusely atrophic. Soft tissues Slightly heterogeneous, high density, lobulated subcutaneus soft tissue lesion along the anterior and lateral soft tissues measuring approximately 10 x 4 x 14 cm in centered along the mid leg. Associated diffuse subcutaneus soft tissue edema and dermal thickening. No subcutaneus soft tissue emphysema. No definite deep fascial edema. No retained radiopaque foreign body. IMPRESSION: 1. Approximally 10 x 4 x 14 cm subcutaneus soft tissue density along the anterior and lateral mid leg likely representing a hematoma with underlying mass lesion not excluded. Associated diffuse subcutaneus soft tissue edema and dermal thickening. Superimposed infection not excluded. Limited evaluation on this noncontrast study. 2. 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 Lateral subluxation of the patella in relation to the patellofemoral joint with impaction of the patella onto the and anterior right femoral condyle. Electronically Signed   By: Iven Finn M.D.   On: 10/14/2020 18:13   DG Knee Complete 4 Views Right  Result Date: 10/14/2020 CLINICAL DATA:  Post fall, now with right knee pain. EXAM: RIGHT KNEE - COMPLETE 4+ VIEW COMPARISON:  Right tibia and fibular radiographs-earlier same day FINDINGS: No  fracture or dislocation. No joint effusion or evidence of lipohemarthrosis Severe tricompartmental degenerative change of knee, worse within the medial compartment with complete joint space loss, bone-on-bone articulation, subchondral sclerosis and osteophytosis. There is minimal spurring of the tibial spines. No evidence of chondrocalcinosis. No radiopaque foreign body.  IMPRESSION: 1. No fracture or dislocation. 2. 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 Electronically Signed   By: Sandi Mariscal M.D.   On: 10/14/2020 15:31   VAS [place] LOWER EXTREMITY VENOUS (DVT)  Result Date: 10/15/2020  Lower Venous DVT Study Patient Name:  Whitney Blackburn  Date of Exam:   10/15/2020 Medical Rec #: [id number]        Accession #:    [id number] Date of Birth: 1955/10/24        Patient Gender: F Patient Age:   34 years Exam Location:  [facility] Procedure:      VAS [place] LOWER EXTREMITY VENOUS (DVT) Referring Phys: Frederich Chick Preesha Benjamin --------------------------------------------------------------------------------  Indications: Edema, Erythema, and s/p fall on right side x1 day.  Limitations: Body habitus, poor ultrasound/tissue interface and depth of vessels, position. Comparison Study: No prior study Performing Technologist: Maudry Mayhew MHA, RDMS, RVT, RDCS  Examination Guidelines: A complete evaluation includes B-mode imaging, spectral Doppler, color Doppler, and power Doppler as needed of all accessible portions of each vessel. Bilateral testing is considered an integral part of a complete examination. Limited examinations for reoccurring indications may be performed as noted. The reflux portion of the exam is performed with the patient in reverse Trendelenburg.  +---------+---------------+---------+-----------+----------+--------------+ RIGHT    CompressibilityPhasicitySpontaneityPropertiesThrombus Aging +---------+---------------+---------+-----------+----------+--------------+ CFV      Full           Yes      Yes                                 +---------+---------------+---------+-----------+----------+--------------+ SFJ      Full                                                        +---------+---------------+---------+-----------+----------+--------------+ FV Prox  Full                                                         +---------+---------------+---------+-----------+----------+--------------+ FV Mid   Full                                                        +---------+---------------+---------+-----------+----------+--------------+ FV DistalFull                                                        +---------+---------------+---------+-----------+----------+--------------+ PFV      Full                                                        +---------+---------------+---------+-----------+----------+--------------+  POP      Full           Yes      Yes                                 +---------+---------------+---------+-----------+----------+--------------+   Right Technical Findings: Not visualized segments include PTV, peroneal veins.  Left Technical Findings: Not visualized segments include CFV.   Summary: RIGHT: - There is no evidence of deep vein thrombosis in the lower extremity. However, portions of this examination were limited- see technologist comments above.  - No cystic structure found in the popliteal fossa.   *See table(s) above for measurements and observations. Electronically signed by Harold Barban MD on 10/15/2020 at 94:27:22 PM.    Final       DVT prophylaxis: Ted hose  Code Status: Full code  Family Communication: No family at bedside   Consultants:   Procedures:     Objective    Physical Examination:  General-appears in no acute distress Heart-S1-S2, regular, no murmur auscultated Lungs-clear to auscultation bilaterally, no wheezing or crackles auscultated Abdomen-soft, nontender, no organomegaly Extremities-2+ edema of the right lower extremity, mild erythema, sensations intact, nontender to palpation Neuro-alert, oriented x3, no focal deficit noted   Status is: Inpatient  Dispo: The patient is from: Home              Anticipated d/c is to: Skilled nursing facility              Anticipated d/c date is: 10/18/2020               Patient currently not stable for discharge  Barrier to discharge-right leg hematoma, generalized weakness, chronic pain  COVID-19 Labs  No results for input(s): DDIMER, FERRITIN, LDH, CRP in the last 72 hours.  Lab Results  Component Value Date   [place] NEGATIVE 10/14/2020    Microbiology  Recent Results (from the past 240 hour(s))  Resp Panel by RT-PCR (Flu A&B, Covid) Nasopharyngeal Swab     Status: None   Collection Time: 10/14/20  5:44 PM   Specimen: Nasopharyngeal Swab; Nasopharyngeal(NP) swabs in vial transport medium  Result Value Ref Range Status   SARS Coronavirus 2 by RT PCR NEGATIVE NEGATIVE Final    Comment: (NOTE) SARS-CoV-2 target nucleic acids are NOT DETECTED.  The SARS-CoV-2 RNA is generally detectable in upper respiratory specimens during the acute phase of infection. The lowest concentration of SARS-CoV-2 viral copies this assay can detect is 138 copies/mL. A negative result does not preclude SARS-Cov-2 infection and should not be used as the sole basis for treatment or other patient management decisions. A negative result may occur with  improper specimen collection/handling, submission of specimen other than nasopharyngeal swab, presence of viral mutation(s) within the areas targeted by this assay, and inadequate number of viral copies(<138 copies/mL). A negative result must be combined with clinical observations, patient history, and epidemiological information. The expected result is Negative.  Fact Sheet for Patients:  EntrepreneurPulse.com.au  Fact Sheet for Healthcare Providers:  IncredibleEmployment.be  This test is no t yet approved or cleared by the [place] FDA and  has been authorized for detection and/or diagnosis of SARS-CoV-2 by FDA under an Emergency Use Authorization (EUA). This EUA will remain  in effect (meaning this test can be used) for the duration of the COVID-19 declaration under  Section 564(b)(1) of the Act, 21 U.S.C.section 360bbb-3(b)(1), unless the authorization is  terminated  or revoked sooner.       Influenza A by PCR NEGATIVE NEGATIVE Final   Influenza B by PCR NEGATIVE NEGATIVE Final    Comment: (NOTE) The Xpert Xpress SARS-CoV-2/FLU/RSV plus assay is intended as an aid in the diagnosis of influenza from Nasopharyngeal swab specimens and should not be used as a sole basis for treatment. Nasal washings and aspirates are unacceptable for Xpert Xpress SARS-CoV-2/FLU/RSV testing.  Fact Sheet for Patients: EntrepreneurPulse.com.au  Fact Sheet for Healthcare Providers: IncredibleEmployment.be  This test is not yet approved or cleared by the [place] FDA and has been authorized for detection and/or diagnosis of SARS-CoV-2 by FDA under an Emergency Use Authorization (EUA). This EUA will remain in effect (meaning this test can be used) for the duration of the COVID-19 declaration under Section 564(b)(1) of the Act, 21 U.S.C. section 360bbb-3(b)(1), unless the authorization is terminated or revoked.  Performed at [facility], [address] [place], [place] [address]              Oswald Hillock   [facility] If 7PM-7AM, please contact night-coverage at [email or web address], Office  [phone]   10/15/2020, 6:33 PM  LOS: 0 days

## 2020-10-15 NOTE — TOC Initial Note (Signed)
Transition of Care [facility]) - Initial/Assessment Note    Patient Details  Name: Whitney Blackburn MRN: [id number] Date of Birth: 10-06-1955  [facility] CM/SW Contact:    Trish Mage, LCSW Phone Number: 10/15/2020, 11:14 AM  Clinical Narrative:    Patient seen in follow up to PT recommendation of SNF.  Whitney Blackburn lives in an apartment [place] the [place] of [place], and her sister lives in [place].  She states she knows she needs SNF, and [place]. Bed search explained.  Insurance authorization request explained.  Bed search initiated. TOC will continue to follow during the course of hospitalization.                Expected Discharge Plan: Skilled Nursing Facility Barriers to Discharge: SNF Pending bed offer   Patient Goals and CMS Choice     Choice offered to / list presented to : Patient  Expected Discharge Plan and Services Expected Discharge Plan: [place]   Discharge Planning Services: CM Consult Post Acute Care Choice: [place] Living arrangements for the past 2 months: Apartment                                      Prior Living Arrangements/Services Living arrangements for the past 2 months: Apartment Lives with:: Self Patient language and need for interpreter reviewed:: Yes        Need for Family Participation in Patient Care: Yes (Comment) Care giver support system in place?: Yes (comment)   Criminal Activity/Legal Involvement Pertinent to Current Situation/Hospitalization: No - Comment as needed  Activities of Daily Living Home Assistive Devices/Equipment: Wheelchair, Environmental consultant (specify type) ADL Screening (condition at time of admission) Patient's cognitive ability adequate to safely complete daily activities?: Yes Is the patient deaf or have difficulty hearing?: No Does the patient have difficulty seeing, even when wearing glasses/contacts?: No Does the patient have  difficulty concentrating, remembering, or making decisions?: No Patient able to express need for assistance with ADLs?: Yes Does the patient have difficulty dressing or bathing?: Yes Independently performs ADLs?: No Communication: Independent Dressing (OT): Needs assistance Is this a change from baseline?: Pre-admission baseline Grooming: Independent Feeding: Independent Bathing: Needs assistance Is this a change from baseline?: Pre-admission baseline Toileting: Needs assistance Is this a change from baseline?: Change from baseline, expected to last >3days In/Out Bed: Needs assistance Is this a change from baseline?: Change from baseline, expected to last >3 days Walks in Home: Needs assistance Is this a change from baseline?: Change from baseline, expected to last >3 days Does the patient have difficulty walking or climbing stairs?: Yes Weakness of Legs: Both Weakness of Arms/Hands: None  Permission Sought/Granted Permission sought to share information with : Family Supports Permission granted to share information with : Yes, Verbal Permission Granted  Share Information with NAME: Whitney Blackburn (Sister)   [phone]           Emotional Assessment Appearance:: Appears stated age Attitude/Demeanor/Rapport: Engaged Affect (typically observed): Appropriate Orientation: : Oriented to Self, Oriented to Place, Oriented to  Time, Oriented to Situation Alcohol / Substance Use: Not Applicable Psych Involvement: No (comment)  Admission diagnosis:  Intractable pain [R52] Patient Active Problem List   Diagnosis Date Noted   Intractable pain 10/14/2020   Morbid obesity with BMI of 50.0-59.9, adult ([place]) 10/14/2020   Diabetes mellitus type 2 in  obese ([place]) 10/14/2020   Essential hypertension 10/14/2020   Unable to bear weight 10/14/2020   Fall at home, initial encounter 10/14/2020   Unspecified venous (peripheral) insufficiency 06/30/2011   Pain in limb 06/30/2011   HTN  (hypertension) 05/10/2011   Hyperlipidemia 05/10/2011   Hypothyroid 05/10/2011   Leg swelling 05/10/2011   Hidradenitis axillaris 05/10/2011   Plaque psoriasis 05/10/2011   Fungal infection of foot 05/10/2011   Neoplasm of uncertain behavior of skin 05/10/2011   Sleep apnea 05/10/2011   Morbid obesity ([place]) 05/10/2011   PCP:  Cari Caraway, MD Pharmacy:   [facility]  ([place], [place] [facility] [address] [place] [address] [place] [address] Phone: [phone] Fax: [phone]     Social Determinants of Health (SDOH) Interventions    Readmission Risk Interventions No flowsheet data found.

## 2020-10-15 NOTE — Discharge Planning (Addendum)
Late Entry:  RNCM consulted regarding bariatric wheelchair and possible home with home health.  RNCM confirmed that pt could receive wheelchair through [employer] delivered to [facility].    RNCM received call from Dr Nancy Fetter that pt and sister did not feel safe for pt to return home with home health as pt lives alone; is unable to care for herself and can not walk.  RNCM suggested a PT consult to evaluate strength and gait for safety concerns returning home alone.  RNCM notified that pt will have to transfer to a main campus for PT evaluation and possible skilled nursing facility (SNF) workup.Wheelchair ordered and delivery placed on hold pending pt disposition.

## 2020-10-15 NOTE — NC FL2 (Signed)
[place] LEVEL OF CARE SCREENING TOOL     IDENTIFICATION  Patient Name: Whitney Blackburn Birthdate: [birth year] Sex: female Admission Date (Current Location): 10/14/2020  [facility] and [place] Number:  [employer] systems and Address:  [facility],  [place], [place]      Provider Number: [id number]  Attending Physician Name and Address:  Oswald Hillock, MD  Relative Name and Phone Number:  Marica Otter (Sister)   [phone]    Current Level of Care: Hospital Recommended Level of Care: [place] Prior Approval Number:    Date Approved/Denied:   PASRR Number:    Discharge Plan: SNF    Current Diagnoses: Patient Active Problem List   Diagnosis Date Noted   Intractable pain 10/14/2020   Morbid obesity with BMI of 50.0-59.9, adult ([place]) 10/14/2020   Diabetes mellitus type 2 in obese ([place]) 10/14/2020   Essential hypertension 10/14/2020   Unable to bear weight 10/14/2020   Fall at home, initial encounter 10/14/2020   Unspecified venous (peripheral) insufficiency 06/30/2011   Pain in limb 06/30/2011   HTN (hypertension) 05/10/2011   Hyperlipidemia 05/10/2011   Hypothyroid 05/10/2011   Leg swelling 05/10/2011   Hidradenitis axillaris 05/10/2011   Plaque psoriasis 05/10/2011   Fungal infection of foot 05/10/2011   Neoplasm of uncertain behavior of skin 05/10/2011   Sleep apnea 05/10/2011   Morbid obesity ([facility]) 05/10/2011    Orientation RESPIRATION BLADDER Height & Weight     Self, Time, Situation, Place  Normal External catheter Weight: (!) 169.2 kg Height:  '5\' 7"'$  (170.2 cm)  BEHAVIORAL SYMPTOMS/MOOD NEUROLOGICAL BOWEL NUTRITION STATUS   (none)  (none) Continent Diet (see d/c summary)  AMBULATORY STATUS COMMUNICATION OF NEEDS Skin   Extensive Assist Verbally Normal                       Personal Care Assistance Level of Assistance  Bathing, Feeding, Dressing Bathing Assistance: Maximum  assistance Feeding assistance: Independent Dressing Assistance: Limited assistance     Functional Limitations Info  Sight, Hearing, Speech Sight Info: Adequate Hearing Info: Adequate Speech Info: Adequate    SPECIAL CARE FACTORS FREQUENCY  PT (By licensed PT), OT (By licensed OT)     PT Frequency: 5X/W OT Frequency: 5X/W            Contractures Contractures Info: Not present    Additional Factors Info  Code Status, Allergies Code Status Info: full Allergies Info: Dicloxicillin, Shellfish           Current Medications (10/15/2020):  This is the current hospital active medication list Current Facility-Administered Medications  Medication Dose Route Frequency Provider Last Rate Last Admin   0.9 %  sodium chloride infusion  250 mL Intravenous PRN Chotiner, Yevonne Aline, MD       acetaminophen (TYLENOL) tablet 650 mg  650 mg Oral Q6H PRN Chotiner, Yevonne Aline, MD       Or   acetaminophen (TYLENOL) suppository 650 mg  650 mg Rectal Q6H PRN Chotiner, Yevonne Aline, MD       atorvastatin (LIPITOR) tablet 80 mg  80 mg Oral Daily Chotiner, Yevonne Aline, MD   80 mg at 10/15/20 1025   DULoxetine (CYMBALTA) DR capsule 30 mg  30 mg Oral QHS Chotiner, Yevonne Aline, MD   30 mg at 10/14/20 2247   ezetimibe (ZETIA) tablet 10 mg  10 mg Oral Daily Chotiner, Yevonne Aline, MD   10 mg at 10/15/20 1113  hydrochlorothiazide (HYDRODIURIL) tablet 25 mg  25 mg Oral Daily Chotiner, Yevonne Aline, MD   25 mg at 10/15/20 1025   HYDROcodone-acetaminophen (NORCO/VICODIN) 5-325 MG per tablet 1-2 tablet  1-2 tablet Oral Q6H PRN Chotiner, Yevonne Aline, MD   2 tablet at 10/15/20 1025   HYDROmorphone (DILAUDID) injection 1 mg  1 mg Intravenous Q3H PRN Chotiner, Yevonne Aline, MD       levothyroxine (SYNTHROID) tablet 100 mcg  100 mcg Oral Q0600 Chotiner, Yevonne Aline, MD   100 mcg at 10/15/20 0527   lisinopril (ZESTRIL) tablet 40 mg  40 mg Oral Daily Chotiner, Yevonne Aline, MD   40 mg at 10/15/20 1025   melatonin tablet 3 mg  3 mg Oral QHS  Chotiner, Yevonne Aline, MD   3 mg at 10/14/20 2247   metFORMIN (GLUCOPHAGE-XR) 24 hr tablet 500 mg  500 mg Oral BID WC Chotiner, Yevonne Aline, MD   500 mg at 10/15/20 1113   metoprolol succinate (TOPROL-XL) 24 hr tablet 25 mg  25 mg Oral Daily Chotiner, Yevonne Aline, MD   25 mg at 10/15/20 1024   senna-docusate (Senokot-S) tablet 1 tablet  1 tablet Oral QHS PRN Chotiner, Yevonne Aline, MD       sodium chloride flush (NS) 0.9 % injection 3 mL  3 mL Intravenous Q12H Chotiner, Yevonne Aline, MD       sodium chloride flush (NS) 0.9 % injection 3 mL  3 mL Intravenous PRN Chotiner, Yevonne Aline, MD         Discharge Medications: Please see discharge summary for a list of discharge medications.  Relevant Imaging Results:  Relevant Lab Results:   Additional Information [place], [place]

## 2020-10-15 NOTE — Consult Note (Signed)
ORTHOPAEDIC CONSULTATION  REQUESTING PHYSICIAN: Oswald Hillock, MD  Chief Complaint: Right lower extremity swelling after fall. C/s to rule out compartment syndrome.  HPI: Whitney Blackburn is a 65 y.o. female who sustained a fall at home yesterday when her shoe broke she landed onto her right lower extremity.  She had significant bruising and swelling to the right lower extremity and presented for further evaluation.  She underwent work-up including x-rays and a CT scan which did not show a fracture but showed a large subcutaneous hematoma.  Given the amount of swelling and size of the hematoma the primary team was concerned about possible compartment syndrome.  The patient has a history of chronic lymphedema and has seen a vein doctor previously but not getting any kind of current treatment for her lymphedema..  Past Medical History:  Diagnosis Date   Allergy    Anemia    Arthritis    Cataracts, bilateral    Chicken pox    Depression    Eating disorder    1980's   Fainting spell 1980's   noted in 1980's   Glaucoma    Heart murmur    Hyperlipidemia    Hypertension    noted high readings in the past, per has not been to MD in 3 years   Macular degeneration    Skin rash    Ulcer 1980   Past Surgical History:  Procedure Laterality Date   retina     RETINAL DETACHMENT SURGERY  1992   Social History   Socioeconomic History   Marital status: Single    Spouse name: Not on file   Number of children: Not on file   Years of education: 12   Highest education level: 12th grade  Occupational History   Occupation: Retired  Tobacco Use   Smoking status: Never   Smokeless tobacco: Never  Vaping Use   Vaping Use: Never used  Substance and Sexual Activity   Alcohol use: No   Drug use: No   Sexual activity: Not Currently  Other Topics Concern   Not on file  Social History Narrative   Not on file   Social Determinants of Health   Financial Resource Strain: Not on file  Food  Insecurity: Not on file  Transportation Needs: Not on file  Physical Activity: Not on file  Stress: Not on file  Social Connections: Not on file   Family History  Problem Relation Age of Onset   Crohn's disease Father    Early death Father    Hyperlipidemia Mother    Diabetes Sister    Crohn's disease Paternal Uncle    Hyperlipidemia Maternal Grandmother    Hypertension Maternal Grandmother    Hyperlipidemia Maternal Grandfather    Hypertension Maternal Grandfather    Allergies  Allergen Reactions   Dicloxacillin    Shellfish Allergy      Positive ROS: All other systems have been reviewed and were otherwise negative with the exception of those mentioned in the HPI and as above.  Physical Exam: General: Alert, no acute distress Cardiovascular: severe bilateral lymphedema Respiratory: No cyanosis, no use of accessory musculature Skin: Skin is intact large area of dark red ecchymosis over the anterior tibia on the right lower extremity.  Lymphedema changes to the left lower extremity without active drainage Neurologic: Sensation decreased bilaterally symmetrically.  Patient reports this is her baseline Psychiatric: Patient is competent for consent with normal mood and affect  MUSCULOSKELETAL: Bilateral lower extremities with severe lymphedema however the  skin is intact.  Large area of ecchymosis over the anterior tibia.  Patient is tender to palpation over the area of ecchymosis.  Compartments of all 4 lower extremities are soft and compressible in the right lower extremity no pain with passive stretch of the ankle or toes intact active ankle dorsiflexion and plantarflexion without discomfort or pain.  Intact active knee flexion extension without significant discomfort.  Assessment: Active Problems:   Hypothyroid   Pain in limb   Intractable pain   Morbid obesity with BMI of 50.0-59.9, adult (HCC)   Diabetes mellitus type 2 in obese [facility])   Essential hypertension   Unable to  bear weight   Fall at home, initial encounter  Bilateral lower extremity lymphedema, traumatic ecchymosis and hematoma of the right anterior lower leg.  No concern for compartment syndrome at this time.  Plan: Discussed with the patient signs of acute compartment syndrome including severe intractable pain not controlled with as needed pain meds.  Pain with passive stretch of the ankle and toes.  Acute paresthesias.  Discussed if patient notes any changes in her symptoms to let us know otherwise anticipate her ecchymosis and hematoma will gradually resolve with time.  She can be weightbearing as tolerated without restrictions.  She can follow-up as needed with her primary care provider.  I recommended she also seek treatment with a specialist for her lymphedema.    Willaim Sheng, MD Cell [phone]   10/15/2020 7:04 PM

## 2020-10-16 DIAGNOSIS — I87312 Chronic venous hypertension (idiopathic) with ulcer of left lower extremity: Secondary | ICD-10-CM | POA: Diagnosis not present

## 2020-10-16 DIAGNOSIS — M7731 Calcaneal spur, right foot: Secondary | ICD-10-CM | POA: Diagnosis not present

## 2020-10-16 DIAGNOSIS — E1140 Type 2 diabetes mellitus with diabetic neuropathy, unspecified: Secondary | ICD-10-CM | POA: Diagnosis not present

## 2020-10-16 DIAGNOSIS — G4730 Sleep apnea, unspecified: Secondary | ICD-10-CM | POA: Diagnosis not present

## 2020-10-16 DIAGNOSIS — E119 Type 2 diabetes mellitus without complications: Secondary | ICD-10-CM | POA: Diagnosis not present

## 2020-10-16 DIAGNOSIS — E1169 Type 2 diabetes mellitus with other specified complication: Secondary | ICD-10-CM | POA: Diagnosis not present

## 2020-10-16 DIAGNOSIS — H538 Other visual disturbances: Secondary | ICD-10-CM | POA: Diagnosis not present

## 2020-10-16 DIAGNOSIS — S83011D Lateral subluxation of right patella, subsequent encounter: Secondary | ICD-10-CM | POA: Diagnosis not present

## 2020-10-16 DIAGNOSIS — Y92009 Unspecified place in unspecified non-institutional (private) residence as the place of occurrence of the external cause: Secondary | ICD-10-CM | POA: Diagnosis not present

## 2020-10-16 DIAGNOSIS — W19XXXA Unspecified fall, initial encounter: Secondary | ICD-10-CM | POA: Diagnosis not present

## 2020-10-16 DIAGNOSIS — M79674 Pain in right toe(s): Secondary | ICD-10-CM | POA: Diagnosis not present

## 2020-10-16 DIAGNOSIS — E1100 Type 2 diabetes mellitus with hyperosmolarity without nonketotic hyperglycemic-hyperosmolar coma (NKHHC): Secondary | ICD-10-CM | POA: Diagnosis not present

## 2020-10-16 DIAGNOSIS — E1022 Type 1 diabetes mellitus with diabetic chronic kidney disease: Secondary | ICD-10-CM | POA: Diagnosis not present

## 2020-10-16 DIAGNOSIS — M79671 Pain in right foot: Secondary | ICD-10-CM | POA: Diagnosis not present

## 2020-10-16 DIAGNOSIS — E785 Hyperlipidemia, unspecified: Secondary | ICD-10-CM | POA: Diagnosis not present

## 2020-10-16 DIAGNOSIS — M79661 Pain in right lower leg: Secondary | ICD-10-CM | POA: Diagnosis not present

## 2020-10-16 DIAGNOSIS — M19071 Primary osteoarthritis, right ankle and foot: Secondary | ICD-10-CM | POA: Diagnosis not present

## 2020-10-16 DIAGNOSIS — R2681 Unsteadiness on feet: Secondary | ICD-10-CM | POA: Diagnosis not present

## 2020-10-16 DIAGNOSIS — K219 Gastro-esophageal reflux disease without esophagitis: Secondary | ICD-10-CM | POA: Diagnosis not present

## 2020-10-16 DIAGNOSIS — Z79899 Other long term (current) drug therapy: Secondary | ICD-10-CM | POA: Diagnosis not present

## 2020-10-16 DIAGNOSIS — M1711 Unilateral primary osteoarthritis, right knee: Secondary | ICD-10-CM | POA: Diagnosis not present

## 2020-10-16 DIAGNOSIS — M25561 Pain in right knee: Secondary | ICD-10-CM | POA: Diagnosis not present

## 2020-10-16 DIAGNOSIS — D649 Anemia, unspecified: Secondary | ICD-10-CM | POA: Diagnosis not present

## 2020-10-16 DIAGNOSIS — E039 Hypothyroidism, unspecified: Secondary | ICD-10-CM | POA: Diagnosis not present

## 2020-10-16 DIAGNOSIS — G8929 Other chronic pain: Secondary | ICD-10-CM | POA: Diagnosis not present

## 2020-10-16 DIAGNOSIS — L299 Pruritus, unspecified: Secondary | ICD-10-CM | POA: Diagnosis not present

## 2020-10-16 DIAGNOSIS — R531 Weakness: Secondary | ICD-10-CM | POA: Diagnosis not present

## 2020-10-16 DIAGNOSIS — L03115 Cellulitis of right lower limb: Secondary | ICD-10-CM | POA: Diagnosis not present

## 2020-10-16 DIAGNOSIS — S8011XD Contusion of right lower leg, subsequent encounter: Secondary | ICD-10-CM | POA: Diagnosis not present

## 2020-10-16 DIAGNOSIS — Z9181 History of falling: Secondary | ICD-10-CM | POA: Diagnosis not present

## 2020-10-16 DIAGNOSIS — Z7984 Long term (current) use of oral hypoglycemic drugs: Secondary | ICD-10-CM | POA: Diagnosis not present

## 2020-10-16 DIAGNOSIS — R262 Difficulty in walking, not elsewhere classified: Secondary | ICD-10-CM | POA: Diagnosis not present

## 2020-10-16 DIAGNOSIS — R600 Localized edema: Secondary | ICD-10-CM | POA: Diagnosis not present

## 2020-10-16 DIAGNOSIS — L400 Psoriasis vulgaris: Secondary | ICD-10-CM | POA: Diagnosis not present

## 2020-10-16 DIAGNOSIS — R21 Rash and other nonspecific skin eruption: Secondary | ICD-10-CM | POA: Diagnosis not present

## 2020-10-16 DIAGNOSIS — Z743 Need for continuous supervision: Secondary | ICD-10-CM | POA: Diagnosis not present

## 2020-10-16 DIAGNOSIS — Z85828 Personal history of other malignant neoplasm of skin: Secondary | ICD-10-CM | POA: Diagnosis not present

## 2020-10-16 DIAGNOSIS — H409 Unspecified glaucoma: Secondary | ICD-10-CM | POA: Diagnosis not present

## 2020-10-16 DIAGNOSIS — Z7401 Bed confinement status: Secondary | ICD-10-CM | POA: Diagnosis not present

## 2020-10-16 DIAGNOSIS — R5381 Other malaise: Secondary | ICD-10-CM | POA: Diagnosis not present

## 2020-10-16 DIAGNOSIS — M79604 Pain in right leg: Secondary | ICD-10-CM | POA: Diagnosis not present

## 2020-10-16 DIAGNOSIS — I890 Lymphedema, not elsewhere classified: Secondary | ICD-10-CM | POA: Diagnosis not present

## 2020-10-16 DIAGNOSIS — S8011XA Contusion of right lower leg, initial encounter: Secondary | ICD-10-CM | POA: Diagnosis not present

## 2020-10-16 DIAGNOSIS — S83001D Unspecified subluxation of right patella, subsequent encounter: Secondary | ICD-10-CM | POA: Diagnosis not present

## 2020-10-16 DIAGNOSIS — E1159 Type 2 diabetes mellitus with other circulatory complications: Secondary | ICD-10-CM | POA: Diagnosis not present

## 2020-10-16 DIAGNOSIS — I10 Essential (primary) hypertension: Secondary | ICD-10-CM | POA: Diagnosis not present

## 2020-10-16 DIAGNOSIS — Z20822 Contact with and (suspected) exposure to covid-19: Secondary | ICD-10-CM | POA: Diagnosis not present

## 2020-10-16 DIAGNOSIS — M6281 Muscle weakness (generalized): Secondary | ICD-10-CM | POA: Diagnosis not present

## 2020-10-16 DIAGNOSIS — Z7409 Other reduced mobility: Secondary | ICD-10-CM | POA: Diagnosis not present

## 2020-10-16 DIAGNOSIS — E46 Unspecified protein-calorie malnutrition: Secondary | ICD-10-CM | POA: Diagnosis not present

## 2020-10-16 LAB — GLUCOSE, CAPILLARY
Glucose-Capillary: 89 mg/dL (ref 70–99)
Glucose-Capillary: 120 mg/dL — ABNORMAL HIGH (ref 70–99)
Glucose-Capillary: 157 mg/dL — ABNORMAL HIGH (ref 70–99)
Glucose-Capillary: 140 mg/dL — ABNORMAL HIGH (ref 70–99)

## 2020-10-16 LAB — HEMOGLOBIN A1C
Hgb A1c MFr Bld: 5.6 % (ref 4.8–5.6)
Mean Plasma Glucose: 114.02 mg/dL

## 2020-10-16 MED ORDER — OXYCODONE HCL 5 MG PO TABS: 5.0000 mg | ORAL_TABLET | Freq: Four times a day (QID) | ORAL | 0 refills | Status: AC | PRN

## 2020-10-16 NOTE — Discharge Summary (Signed)
Physician Discharge Summary  Takiah Vandenberg [id number] DOB: April 30, 1955 DOA: 10/14/2020  PCP: Cari Caraway, MD  Admit date: 10/14/2020 Discharge date: 10/16/2020  Time spent: 60 minutes  Recommendations for Outpatient Follow-up:  Patient to be discharged to rehab Follow-up PCP in 2 weeks   Discharge Diagnoses:  Active Problems:   Hypothyroid   Pain in limb   Intractable pain   Morbid obesity with BMI of 50.0-59.9, adult ([place])   Diabetes mellitus type 2 in obese [facility])   Essential hypertension   Unable to bear weight   Fall at home, initial encounter   Discharge Condition: Stable  Diet recommendation: Heart healthy diet  Filed Weights   10/14/20 1323 10/14/20 2140  Weight: (!) 167.8 kg (!) 169.2 kg    History of present illness:  65 year old female with history of diabetes mellitus type 2, hypertension, hyperlipidemia, hypothyroidism, morbid obesity who fell at home landed on her right side.  Uses walker and started to ambulate, fell causing her to lose her balance on the right side.  X-ray of the leg showed no fracture.  CT scan of the leg was obtained which showed no fracture but large subcutaneous hematoma.  Hospital Course:   S/p fall, right lower extremity edema/hematoma -Patient developed lower extremity swelling and hematoma after fall -Right lower extremity venous duplex obtained today was negative for DVT -orthopedics was consulted, no concern for compartment syndrome.  As per orthopedics anticipate ecchymosis and hematoma will gradually resolve with time.   -No signs and symptoms of compartment syndrome -Continue oxycodone as needed for pain   Diabetes mellitus type 2 -Continue metformin   Hypertension -Continue lisinopril, HCTZ, metoprolol -Blood pressure is stable   Hypothyroidism -Continue Synthroid    Procedures:   Consultations: Orthopedics  Discharge Exam: Vitals:   10/15/20 2047 10/16/20 0444  BP: (!) 147/47 (!) 158/58  Pulse: 83 81   Resp: 19 16  Temp: 98.4 F (36.9 C) 98.4 F (36.9 C)  SpO2: 100% 97%    General: Appears in no acute distress Cardiovascular: S1-S2, regular, no murmur auscultated Respiratory: Clear to auscultation bilaterally  Discharge Instructions   Discharge Instructions     Diet - low sodium heart healthy   Complete by: As directed    Increase activity slowly   Complete by: As directed       Allergies as of 10/16/2020       Reactions   Dicloxacillin    Shellfish Allergy         Medication List     TAKE these medications    acetaminophen 500 MG tablet Commonly known as: TYLENOL Take 500 mg by mouth every 6 (six) hours as needed.   atorvastatin 80 MG tablet Commonly known as: LIPITOR Take 80 mg by mouth daily.   CENTRUM SILVER 50+WOMEN PO Take by mouth.   DULoxetine 30 MG capsule Commonly known as: CYMBALTA Take 30 mg by mouth at bedtime. Takes '30mg'$  and '60mg'$  at night   ezetimibe 10 MG tablet Commonly known as: ZETIA Take 10 mg by mouth daily.   famotidine 20 MG tablet Commonly known as: PEPCID Take 20 mg by mouth as needed for heartburn or indigestion.   glipiZIDE 10 MG 24 hr tablet Commonly known as: GLUCOTROL XL Take 10 mg by mouth daily with breakfast.   hydrochlorothiazide 25 MG tablet Commonly known as: HYDRODIURIL Take 25 mg by mouth daily.   levothyroxine 100 MCG tablet Commonly known as: SYNTHROID Take 1 tablet (100 mcg total) by mouth daily.  lisinopril 40 MG tablet Commonly known as: ZESTRIL Take 40 mg by mouth daily.   melatonin 3 MG Tabs tablet Take 3 mg by mouth.   metFORMIN 500 MG 24 hr tablet Commonly known as: GLUCOPHAGE-XR Take 500 mg by mouth 2 (two) times daily. 2 tabs twice daily   metoprolol succinate 25 MG 24 hr tablet Commonly known as: TOPROL-XL Take 25 mg by mouth daily.   oxyCODONE 5 MG immediate release tablet Commonly known as: Roxicodone Take 1 tablet (5 mg total) by mouth every 6 (six) hours as needed.    Potassium 99 MG Tabs Take 99 mg by mouth.   Vitamin D (Ergocalciferol) 1.25 MG (50000 UNIT) Caps capsule Commonly known as: DRISDOL Take 50,000 Units by mouth every 7 (seven) days.               Durable Medical Equipment  (From admission, onward)           Start     Ordered   10/14/20 1813  For home use only DME standard manual wheelchair with seat cushion  Once       Comments: Patient suffers from morbid obesity, lymphedema which impairs their ability to perform daily activities like bathing, dressing, and toileting in the home.  A walker will not resolve issue with performing activities of daily living. A wheelchair will allow patient to safely perform daily activities. Patient can safely propel the wheelchair in the home or has a caregiver who can provide assistance. Length of need Lifetime. Accessories: elevating leg rests (ELRs), wheel locks, extensions and anti-tippers.   10/14/20 1815           Allergies  Allergen Reactions   Dicloxacillin    Shellfish Allergy     Contact information for after-discharge care     Destination     [facility] Preferred SNF .   Service: Skilled Nursing Contact information: [address] [place] [employer] [phone]                      The results of significant diagnostics from this hospitalization (including imaging, microbiology, ancillary and laboratory) are listed below for reference.    Significant Diagnostic Studies: DG Tibia/Fibula Right  Result Date: 10/14/2020 CLINICAL DATA:  Post fall, now with right lower extremity pain, bruising and swelling. EXAM: RIGHT TIBIA AND FIBULA - 2 VIEW COMPARISON:  None. FINDINGS: There is a suspected at least 12.8 x 3.5 cm hematoma involving the subcutaneous tissues of the anterolateral aspect the right lower leg. No associated fracture or dislocation. Scattered adjacent dermal calcifications. No radiopaque foreign body. No subcutaneous  emphysema. Limited visualization of the knee demonstrates severe tricompartmental degenerative change, worse within the medial compartment. Moderate to severe degenerative change is suspected of the ankle though suboptimally evaluated due to obliquity. IMPRESSION: 1. Suspected 12.8 cm hematoma involving the subcutaneous tissues about the anterolateral aspect of the right lower leg without associated fracture or radiopaque foreign body. 2. 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 3. Moderate to severe degenerative change of the ankle is suspected though incompletely evaluated. Electronically Signed   By: Sandi Mariscal M.D.   On: 10/14/2020 15:34   CT Tibia Fibula Right Wo Contrast  Result Date: 10/14/2020 CLINICAL DATA:  Swelling and pain.  Injury to right leg. EXAM: CT OF THE LOWER RIGHT EXTREMITY WITHOUT CONTRAST TECHNIQUE: Multidetector CT imaging of the right lower extremity was performed according to the standard protocol. 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 Lateral subluxation of the patella in relation to the patellofemoral joint with impaction of the patella onto the and anterior right femoral condyle. No cortical erosion. No acute displaced fracture or dislocation of the bones of the leg including knee and ankle. No large joint effusion.  No lipohemarthrosis identified. Ligaments Suboptimally assessed by CT. Muscles and Tendons Diffusely atrophic. Soft tissues Slightly heterogeneous, high density, lobulated subcutaneus soft tissue lesion along the anterior and lateral soft tissues measuring approximately 10 x 4 x 14 cm in centered along the mid leg. Associated diffuse subcutaneus soft tissue edema and dermal thickening. No subcutaneus soft tissue emphysema. No definite deep fascial edema. No retained radiopaque foreign body. IMPRESSION: 1.  Approximally 10 x 4 x 14 cm subcutaneus soft tissue density along the anterior and lateral mid leg likely representing a hematoma with underlying mass lesion not excluded. Associated diffuse subcutaneus soft tissue edema and dermal thickening. Superimposed infection not excluded. Limited evaluation on this noncontrast study. 2. 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 Lateral subluxation of the patella in relation to the patellofemoral joint with impaction of the patella onto the and anterior right femoral condyle. Electronically Signed   By: Iven Finn M.D.   On: 10/14/2020 18:13   DG Knee Complete 4 Views Right  Result Date: 10/14/2020 CLINICAL DATA:  Post fall, now with right knee pain. EXAM: RIGHT KNEE - COMPLETE 4+ VIEW COMPARISON:  Right tibia and fibular radiographs-earlier same day FINDINGS: No fracture or dislocation. No joint effusion or evidence of lipohemarthrosis Severe tricompartmental degenerative change of knee, worse within the medial compartment with complete joint space loss, bone-on-bone articulation, subchondral sclerosis and osteophytosis. There is minimal spurring of the tibial spines. No evidence of chondrocalcinosis. No radiopaque foreign body. IMPRESSION: 1. No fracture or dislocation. 2. 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 Electronically Signed   By: Sandi Mariscal M.D.   On: 10/14/2020 15:31   VAS [place] LOWER EXTREMITY VENOUS (DVT)  Result Date: 10/15/2020  Lower Venous DVT Study Patient Name:  [facility]  Date of Exam:   10/15/2020 Medical Rec #: [id number]        Accession #:    [id number] Date of Birth: [birth year]        Patient Gender: F Patient Age:   55 years Exam Location:  [facility] Procedure:      VAS [place] LOWER EXTREMITY VENOUS (DVT) Referring Phys: Frederich Chick Ramses Klecka --------------------------------------------------------------------------------  Indications: Edema, Erythema, and s/p  fall on right side x1 day.  Limitations: Body habitus, poor ultrasound/tissue interface and depth of vessels, position. Comparison Study: No prior study Performing Technologist: Maudry Mayhew MHA, RDMS, RVT, RDCS  Examination Guidelines: A complete evaluation includes B-mode imaging, spectral Doppler, color Doppler, and power Doppler as needed of all accessible portions of each vessel. Bilateral testing is considered an integral part of a complete examination. Limited examinations for reoccurring indications may be performed as noted. The reflux portion of the exam is performed with the patient in reverse Trendelenburg.  +---------+---------------+---------+-----------+----------+--------------+ RIGHT    CompressibilityPhasicitySpontaneityPropertiesThrombus Aging +---------+---------------+---------+-----------+----------+--------------+ CFV      Full           Yes      Yes                                 +---------+---------------+---------+-----------+----------+--------------+  SFJ      Full                                                        +---------+---------------+---------+-----------+----------+--------------+ FV Prox  Full                                                        +---------+---------------+---------+-----------+----------+--------------+ FV Mid   Full                                                        +---------+---------------+---------+-----------+----------+--------------+ FV DistalFull                                                        +---------+---------------+---------+-----------+----------+--------------+ PFV      Full                                                        +---------+---------------+---------+-----------+----------+--------------+ POP      Full           Yes      Yes                                 +---------+---------------+---------+-----------+----------+--------------+   Right Technical  Findings: Not visualized segments include PTV, peroneal veins.  Left Technical Findings: Not visualized segments include CFV.   Summary: RIGHT: - There is no evidence of deep vein thrombosis in the lower extremity. However, portions of this examination were limited- see technologist comments above.  - No cystic structure found in the popliteal fossa.   *See table(s) above for measurements and observations. Electronically signed by Harold Barban MD on 10/15/2020 at 64:27:22 PM.    Final     Microbiology: Recent Results (from the past 240 hour(s))  Resp Panel by RT-PCR (Flu A&B, Covid) Nasopharyngeal Swab     Status: None   Collection Time: 10/14/20  5:44 PM   Specimen: Nasopharyngeal Swab; Nasopharyngeal(NP) swabs in vial transport medium  Result Value Ref Range Status   SARS Coronavirus 2 by RT PCR NEGATIVE NEGATIVE Final    Comment: (NOTE) SARS-CoV-2 target nucleic acids are NOT DETECTED.  The SARS-CoV-2 RNA is generally detectable in upper respiratory specimens during the acute phase of infection. The lowest concentration of SARS-CoV-2 viral copies this assay can detect is 138 copies/mL. A negative result does not preclude SARS-Cov-2 infection and should not be used as the sole basis for treatment or other patient management decisions. A negative result may occur with  improper specimen collection/handling, submission of specimen other than nasopharyngeal swab, presence of viral mutation(s) within the  areas targeted by this assay, and inadequate number of viral copies(<138 copies/mL). A negative result must be combined with clinical observations, patient history, and epidemiological information. The expected result is Negative.  Fact Sheet for Patients:  EntrepreneurPulse.com.au  Fact Sheet for Healthcare Providers:  IncredibleEmployment.be  This test is no t yet approved or cleared by the [place] FDA and  has been authorized for detection  and/or diagnosis of SARS-CoV-2 by FDA under an Emergency Use Authorization (EUA). This EUA will remain  in effect (meaning this test can be used) for the duration of the COVID-19 declaration under Section 564(b)(1) of the Act, 21 U.S.C.section 360bbb-3(b)(1), unless the authorization is terminated  or revoked sooner.       Influenza A by PCR NEGATIVE NEGATIVE Final   Influenza B by PCR NEGATIVE NEGATIVE Final    Comment: (NOTE) The Xpert Xpress SARS-CoV-2/FLU/RSV plus assay is intended as an aid in the diagnosis of influenza from Nasopharyngeal swab specimens and should not be used as a sole basis for treatment. Nasal washings and aspirates are unacceptable for Xpert Xpress SARS-CoV-2/FLU/RSV testing.  Fact Sheet for Patients: EntrepreneurPulse.com.au  Fact Sheet for Healthcare Providers: IncredibleEmployment.be  This test is not yet approved or cleared by the [place] FDA and has been authorized for detection and/or diagnosis of SARS-CoV-2 by FDA under an Emergency Use Authorization (EUA). This EUA will remain in effect (meaning this test can be used) for the duration of the COVID-19 declaration under Section 564(b)(1) of the Act, 21 U.S.C. section 360bbb-3(b)(1), unless the authorization is terminated or revoked.  Performed at [facility], [address] [place], [place] [address]      Labs: Basic Metabolic Panel: Recent Labs  Lab 10/14/20 1754 10/15/20 0544  NA 141 142  K 4.5 4.3  CL 103 106  CO2 27 30  GLUCOSE 94 87  BUN 20 25*  CREATININE 0.91 0.99  CALCIUM 8.9 8.8*   Liver Function Tests: No results for input(s): AST, ALT, ALKPHOS, BILITOT, PROT, ALBUMIN in the last 168 hours. No results for input(s): LIPASE, AMYLASE in the last 168 hours. No results for input(s): AMMONIA in the last 168 hours. CBC: Recent Labs  Lab 10/14/20 1754  WBC 13.8*  NEUTROABS 11.5*  HGB 12.8  HCT 39.1  MCV 92.9   PLT 245     CBG: Recent Labs  Lab 10/15/20 2248 10/16/20 0813  GLUCAP 111* 89       Signed:  Oswald Hillock MD.  [facility] 10/16/2020, 12:15 PM

## 2020-10-16 NOTE — Progress Notes (Signed)
Spoke with [facility] and they say pt will be picked up within the hour for transport to [facility]. I called and updated the staff there  ( spoke with Danae Chen).

## 2020-10-16 NOTE — TOC Transition Note (Signed)
Transition of Care [facility]) - CM/SW Discharge Note   Patient Details  Name: Whitney Blackburn MRN: [id number] Date of Birth: 1955-08-10  [facility]) CM/SW Contact:  Trish Mage, LCSW Phone Number: 10/16/2020, 1:06 PM   Clinical Narrative:   Patient who is stable for discharge today will transfer to [facility].  [facility] arranged.  Nursing, please call report to [phone], room 125. TOC sign off.    Final next level of care: Skilled Nursing Facility Barriers to Discharge: Barriers Resolved   Patient Goals and CMS Choice     Choice offered to / list presented to : Patient  Discharge Placement                       Discharge Plan and Services   Discharge Planning Services: CM Consult Post Acute Care Choice: [place]                               Social Determinants of Health (SDOH) Interventions     Readmission Risk Interventions No flowsheet data found.

## 2020-10-16 NOTE — Plan of Care (Signed)
  Problem: Education: Goal: Knowledge of General Education information will improve Description: Including pain rating scale, medication(s)/side effects and non-pharmacologic comfort measures Outcome: Progressing   Problem: Health Behavior/Discharge Planning: Goal: Ability to manage health-related needs will improve Outcome: Progressing   Problem: Clinical Measurements: Goal: Will remain free from infection Outcome: Progressing   Problem: Clinical Measurements: Goal: Respiratory complications will improve Outcome: Progressing   Problem: Clinical Measurements: Goal: Diagnostic test results will improve Outcome: Progressing   Problem: Pain Managment: Goal: General experience of comfort will improve Outcome: Progressing   Problem: Safety: Goal: Ability to remain free from injury will improve Outcome: Progressing   Problem: Skin Integrity: Goal: Risk for impaired skin integrity will decrease Outcome: Progressing

## 2020-10-16 NOTE — Progress Notes (Signed)
[facility] here to transport pt, Several bags of personal belongings sent with pt.

## 2020-10-16 NOTE — Progress Notes (Signed)
Report given to Tammie LPN of [facility].

## 2020-10-21 DIAGNOSIS — R262 Difficulty in walking, not elsewhere classified: Secondary | ICD-10-CM | POA: Diagnosis not present

## 2020-10-21 DIAGNOSIS — M79671 Pain in right foot: Secondary | ICD-10-CM | POA: Diagnosis not present

## 2020-10-21 DIAGNOSIS — R5381 Other malaise: Secondary | ICD-10-CM | POA: Diagnosis not present

## 2020-10-21 DIAGNOSIS — M25561 Pain in right knee: Secondary | ICD-10-CM | POA: Diagnosis not present

## 2020-10-22 DIAGNOSIS — I87312 Chronic venous hypertension (idiopathic) with ulcer of left lower extremity: Secondary | ICD-10-CM | POA: Diagnosis not present

## 2020-10-22 DIAGNOSIS — E1140 Type 2 diabetes mellitus with diabetic neuropathy, unspecified: Secondary | ICD-10-CM | POA: Diagnosis not present

## 2020-10-22 DIAGNOSIS — S8011XD Contusion of right lower leg, subsequent encounter: Secondary | ICD-10-CM | POA: Diagnosis not present

## 2020-10-22 DIAGNOSIS — I890 Lymphedema, not elsewhere classified: Secondary | ICD-10-CM | POA: Diagnosis not present

## 2020-10-23 DIAGNOSIS — R531 Weakness: Secondary | ICD-10-CM | POA: Diagnosis not present

## 2020-10-23 DIAGNOSIS — E1022 Type 1 diabetes mellitus with diabetic chronic kidney disease: Secondary | ICD-10-CM | POA: Diagnosis not present

## 2020-10-23 DIAGNOSIS — E1100 Type 2 diabetes mellitus with hyperosmolarity without nonketotic hyperglycemic-hyperosmolar coma (NKHHC): Secondary | ICD-10-CM | POA: Diagnosis not present

## 2020-10-24 DIAGNOSIS — L03115 Cellulitis of right lower limb: Secondary | ICD-10-CM | POA: Diagnosis not present

## 2020-10-24 DIAGNOSIS — L299 Pruritus, unspecified: Secondary | ICD-10-CM | POA: Diagnosis not present

## 2020-10-24 DIAGNOSIS — Z7409 Other reduced mobility: Secondary | ICD-10-CM | POA: Diagnosis not present

## 2020-10-24 DIAGNOSIS — K219 Gastro-esophageal reflux disease without esophagitis: Secondary | ICD-10-CM | POA: Diagnosis not present

## 2020-10-24 DIAGNOSIS — H538 Other visual disturbances: Secondary | ICD-10-CM | POA: Diagnosis not present

## 2020-10-24 DIAGNOSIS — M79661 Pain in right lower leg: Secondary | ICD-10-CM | POA: Diagnosis not present

## 2020-10-24 DIAGNOSIS — M25561 Pain in right knee: Secondary | ICD-10-CM | POA: Diagnosis not present

## 2020-10-24 DIAGNOSIS — R21 Rash and other nonspecific skin eruption: Secondary | ICD-10-CM | POA: Diagnosis not present

## 2020-10-29 DIAGNOSIS — I87312 Chronic venous hypertension (idiopathic) with ulcer of left lower extremity: Secondary | ICD-10-CM | POA: Diagnosis not present

## 2020-10-29 DIAGNOSIS — M79661 Pain in right lower leg: Secondary | ICD-10-CM | POA: Diagnosis not present

## 2020-10-29 DIAGNOSIS — M25561 Pain in right knee: Secondary | ICD-10-CM | POA: Diagnosis not present

## 2020-10-29 DIAGNOSIS — Z7409 Other reduced mobility: Secondary | ICD-10-CM | POA: Diagnosis not present

## 2020-10-29 DIAGNOSIS — S8011XD Contusion of right lower leg, subsequent encounter: Secondary | ICD-10-CM | POA: Diagnosis not present

## 2020-10-29 DIAGNOSIS — I890 Lymphedema, not elsewhere classified: Secondary | ICD-10-CM | POA: Diagnosis not present

## 2020-10-29 DIAGNOSIS — R2681 Unsteadiness on feet: Secondary | ICD-10-CM | POA: Diagnosis not present

## 2020-10-29 DIAGNOSIS — E1140 Type 2 diabetes mellitus with diabetic neuropathy, unspecified: Secondary | ICD-10-CM | POA: Diagnosis not present

## 2020-10-29 DIAGNOSIS — R600 Localized edema: Secondary | ICD-10-CM | POA: Diagnosis not present

## 2020-10-30 DIAGNOSIS — R600 Localized edema: Secondary | ICD-10-CM | POA: Diagnosis not present

## 2020-10-30 DIAGNOSIS — E46 Unspecified protein-calorie malnutrition: Secondary | ICD-10-CM | POA: Diagnosis not present

## 2020-10-30 DIAGNOSIS — I890 Lymphedema, not elsewhere classified: Secondary | ICD-10-CM | POA: Diagnosis not present

## 2020-10-30 DIAGNOSIS — R2681 Unsteadiness on feet: Secondary | ICD-10-CM | POA: Diagnosis not present

## 2020-10-30 DIAGNOSIS — E1140 Type 2 diabetes mellitus with diabetic neuropathy, unspecified: Secondary | ICD-10-CM | POA: Diagnosis not present

## 2020-10-30 DIAGNOSIS — S83001D Unspecified subluxation of right patella, subsequent encounter: Secondary | ICD-10-CM | POA: Diagnosis not present

## 2020-10-30 DIAGNOSIS — Z7409 Other reduced mobility: Secondary | ICD-10-CM | POA: Diagnosis not present

## 2020-10-30 DIAGNOSIS — I10 Essential (primary) hypertension: Secondary | ICD-10-CM | POA: Diagnosis not present

## 2020-12-22 DIAGNOSIS — R413 Other amnesia: Secondary | ICD-10-CM | POA: Diagnosis not present

## 2020-12-22 DIAGNOSIS — E7800 Pure hypercholesterolemia, unspecified: Secondary | ICD-10-CM | POA: Diagnosis not present

## 2020-12-22 DIAGNOSIS — E039 Hypothyroidism, unspecified: Secondary | ICD-10-CM | POA: Diagnosis not present

## 2020-12-22 DIAGNOSIS — E1140 Type 2 diabetes mellitus with diabetic neuropathy, unspecified: Secondary | ICD-10-CM | POA: Diagnosis not present

## 2020-12-22 DIAGNOSIS — R5383 Other fatigue: Secondary | ICD-10-CM | POA: Diagnosis not present

## 2020-12-22 DIAGNOSIS — L98499 Non-pressure chronic ulcer of skin of other sites with unspecified severity: Secondary | ICD-10-CM | POA: Diagnosis not present

## 2020-12-22 DIAGNOSIS — Z23 Encounter for immunization: Secondary | ICD-10-CM | POA: Diagnosis not present

## 2020-12-22 DIAGNOSIS — I10 Essential (primary) hypertension: Secondary | ICD-10-CM | POA: Diagnosis not present

## 2020-12-22 DIAGNOSIS — Z7984 Long term (current) use of oral hypoglycemic drugs: Secondary | ICD-10-CM | POA: Diagnosis not present

## 2020-12-22 DIAGNOSIS — E538 Deficiency of other specified B group vitamins: Secondary | ICD-10-CM | POA: Diagnosis not present

## 2020-12-22 DIAGNOSIS — R29898 Other symptoms and signs involving the musculoskeletal system: Secondary | ICD-10-CM | POA: Diagnosis not present

## 2020-12-22 DIAGNOSIS — M25561 Pain in right knee: Secondary | ICD-10-CM | POA: Diagnosis not present

## 2021-03-10 DIAGNOSIS — S83011D Lateral subluxation of right patella, subsequent encounter: Secondary | ICD-10-CM | POA: Diagnosis not present

## 2021-03-10 DIAGNOSIS — S8011XD Contusion of right lower leg, subsequent encounter: Secondary | ICD-10-CM | POA: Diagnosis not present

## 2021-03-10 DIAGNOSIS — M6281 Muscle weakness (generalized): Secondary | ICD-10-CM | POA: Diagnosis not present

## 2021-03-10 DIAGNOSIS — M1711 Unilateral primary osteoarthritis, right knee: Secondary | ICD-10-CM | POA: Diagnosis not present

## 2021-04-10 DIAGNOSIS — M6281 Muscle weakness (generalized): Secondary | ICD-10-CM | POA: Diagnosis not present

## 2021-04-10 DIAGNOSIS — S83011D Lateral subluxation of right patella, subsequent encounter: Secondary | ICD-10-CM | POA: Diagnosis not present

## 2021-04-10 DIAGNOSIS — S8011XD Contusion of right lower leg, subsequent encounter: Secondary | ICD-10-CM | POA: Diagnosis not present

## 2021-04-10 DIAGNOSIS — M1711 Unilateral primary osteoarthritis, right knee: Secondary | ICD-10-CM | POA: Diagnosis not present

## 2021-05-08 DIAGNOSIS — M6281 Muscle weakness (generalized): Secondary | ICD-10-CM | POA: Diagnosis not present

## 2021-05-08 DIAGNOSIS — S83011D Lateral subluxation of right patella, subsequent encounter: Secondary | ICD-10-CM | POA: Diagnosis not present

## 2021-05-08 DIAGNOSIS — M1711 Unilateral primary osteoarthritis, right knee: Secondary | ICD-10-CM | POA: Diagnosis not present

## 2021-05-08 DIAGNOSIS — S8011XD Contusion of right lower leg, subsequent encounter: Secondary | ICD-10-CM | POA: Diagnosis not present

## 2021-06-08 DIAGNOSIS — M1711 Unilateral primary osteoarthritis, right knee: Secondary | ICD-10-CM | POA: Diagnosis not present

## 2021-06-08 DIAGNOSIS — M6281 Muscle weakness (generalized): Secondary | ICD-10-CM | POA: Diagnosis not present

## 2021-06-08 DIAGNOSIS — S83011D Lateral subluxation of right patella, subsequent encounter: Secondary | ICD-10-CM | POA: Diagnosis not present

## 2021-06-08 DIAGNOSIS — S8011XD Contusion of right lower leg, subsequent encounter: Secondary | ICD-10-CM | POA: Diagnosis not present

## 2021-07-08 DIAGNOSIS — M6281 Muscle weakness (generalized): Secondary | ICD-10-CM | POA: Diagnosis not present

## 2021-07-08 DIAGNOSIS — S83011D Lateral subluxation of right patella, subsequent encounter: Secondary | ICD-10-CM | POA: Diagnosis not present

## 2021-07-08 DIAGNOSIS — S8011XD Contusion of right lower leg, subsequent encounter: Secondary | ICD-10-CM | POA: Diagnosis not present

## 2021-08-08 DIAGNOSIS — S8011XD Contusion of right lower leg, subsequent encounter: Secondary | ICD-10-CM | POA: Diagnosis not present

## 2021-08-08 DIAGNOSIS — S83011D Lateral subluxation of right patella, subsequent encounter: Secondary | ICD-10-CM | POA: Diagnosis not present

## 2021-08-08 DIAGNOSIS — M6281 Muscle weakness (generalized): Secondary | ICD-10-CM | POA: Diagnosis not present

## 2021-11-16 DIAGNOSIS — H3530 Unspecified macular degeneration: Secondary | ICD-10-CM | POA: Diagnosis not present

## 2021-11-16 DIAGNOSIS — H04123 Dry eye syndrome of bilateral lacrimal glands: Secondary | ICD-10-CM | POA: Diagnosis not present

## 2021-11-16 DIAGNOSIS — H59819 Chorioretinal scars after surgery for detachment, unspecified eye: Secondary | ICD-10-CM | POA: Diagnosis not present

## 2021-11-23 DIAGNOSIS — M19049 Primary osteoarthritis, unspecified hand: Secondary | ICD-10-CM | POA: Diagnosis not present

## 2021-11-23 DIAGNOSIS — R1032 Left lower quadrant pain: Secondary | ICD-10-CM | POA: Diagnosis not present

## 2021-11-23 DIAGNOSIS — E1140 Type 2 diabetes mellitus with diabetic neuropathy, unspecified: Secondary | ICD-10-CM | POA: Diagnosis not present

## 2021-11-23 DIAGNOSIS — H93293 Other abnormal auditory perceptions, bilateral: Secondary | ICD-10-CM | POA: Diagnosis not present

## 2021-11-23 DIAGNOSIS — H6983 Other specified disorders of Eustachian tube, bilateral: Secondary | ICD-10-CM | POA: Diagnosis not present

## 2021-11-23 DIAGNOSIS — I10 Essential (primary) hypertension: Secondary | ICD-10-CM | POA: Diagnosis not present

## 2021-11-23 DIAGNOSIS — Z23 Encounter for immunization: Secondary | ICD-10-CM | POA: Diagnosis not present

## 2021-11-23 DIAGNOSIS — E7800 Pure hypercholesterolemia, unspecified: Secondary | ICD-10-CM | POA: Diagnosis not present

## 2021-11-23 DIAGNOSIS — E538 Deficiency of other specified B group vitamins: Secondary | ICD-10-CM | POA: Diagnosis not present

## 2021-11-23 DIAGNOSIS — E039 Hypothyroidism, unspecified: Secondary | ICD-10-CM | POA: Diagnosis not present

## 2021-11-23 DIAGNOSIS — R5381 Other malaise: Secondary | ICD-10-CM | POA: Diagnosis not present

## 2021-12-21 DIAGNOSIS — G5603 Carpal tunnel syndrome, bilateral upper limbs: Secondary | ICD-10-CM | POA: Diagnosis not present

## 2021-12-21 DIAGNOSIS — M79644 Pain in right finger(s): Secondary | ICD-10-CM | POA: Diagnosis not present

## 2021-12-21 DIAGNOSIS — M79645 Pain in left finger(s): Secondary | ICD-10-CM | POA: Diagnosis not present

## 2021-12-28 DIAGNOSIS — M170 Bilateral primary osteoarthritis of knee: Secondary | ICD-10-CM | POA: Diagnosis not present

## 2021-12-28 DIAGNOSIS — M1712 Unilateral primary osteoarthritis, left knee: Secondary | ICD-10-CM | POA: Diagnosis not present

## 2021-12-30 DIAGNOSIS — H353223 Exudative age-related macular degeneration, left eye, with inactive scar: Secondary | ICD-10-CM | POA: Diagnosis not present

## 2021-12-30 DIAGNOSIS — H35371 Puckering of macula, right eye: Secondary | ICD-10-CM | POA: Diagnosis not present

## 2021-12-30 DIAGNOSIS — H353111 Nonexudative age-related macular degeneration, right eye, early dry stage: Secondary | ICD-10-CM | POA: Diagnosis not present

## 2022-02-01 DIAGNOSIS — Z7401 Bed confinement status: Secondary | ICD-10-CM | POA: Diagnosis not present

## 2022-02-01 DIAGNOSIS — I10 Essential (primary) hypertension: Secondary | ICD-10-CM | POA: Diagnosis not present

## 2022-02-01 DIAGNOSIS — Z23 Encounter for immunization: Secondary | ICD-10-CM | POA: Diagnosis not present

## 2022-03-29 IMAGING — DX DG TIBIA/FIBULA 2V*R*
1 series · 4 of 4 positions shown · non-contrast
Comparison: None.

CLINICAL DATA: Post fall, now with right lower extremity pain,
bruising and swelling.

EXAM:
RIGHT TIBIA AND FIBULA - 2 VIEW

[Series 1: leg · 0.14mm/px · 4 of 4 slices shown]
[im 1/4]
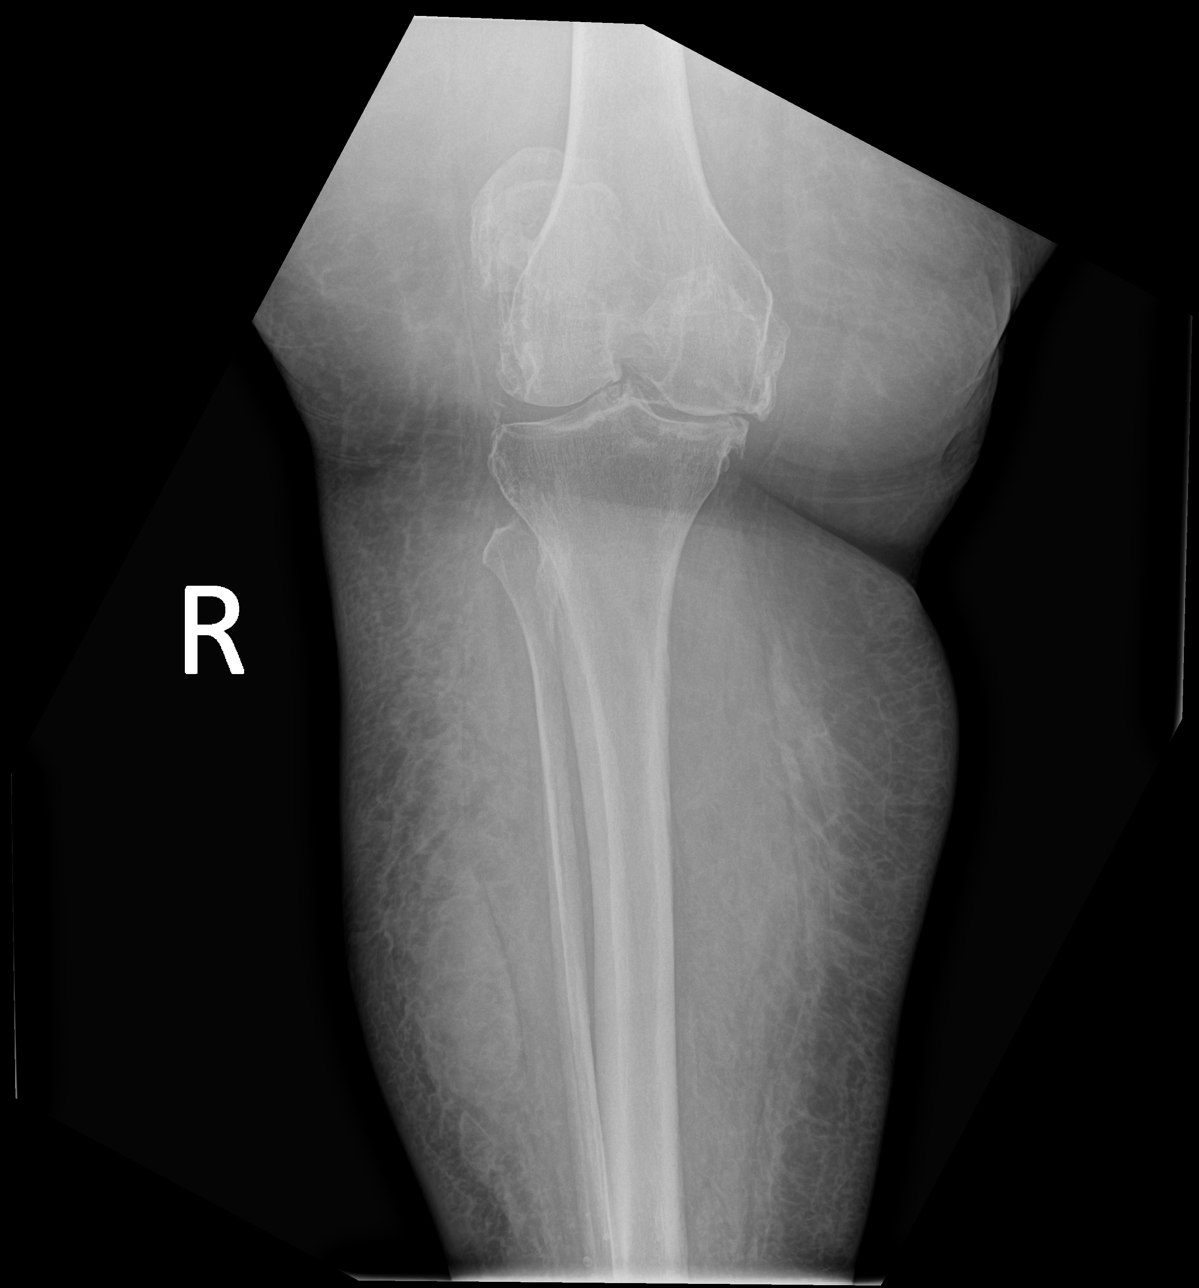
[im 2/4]
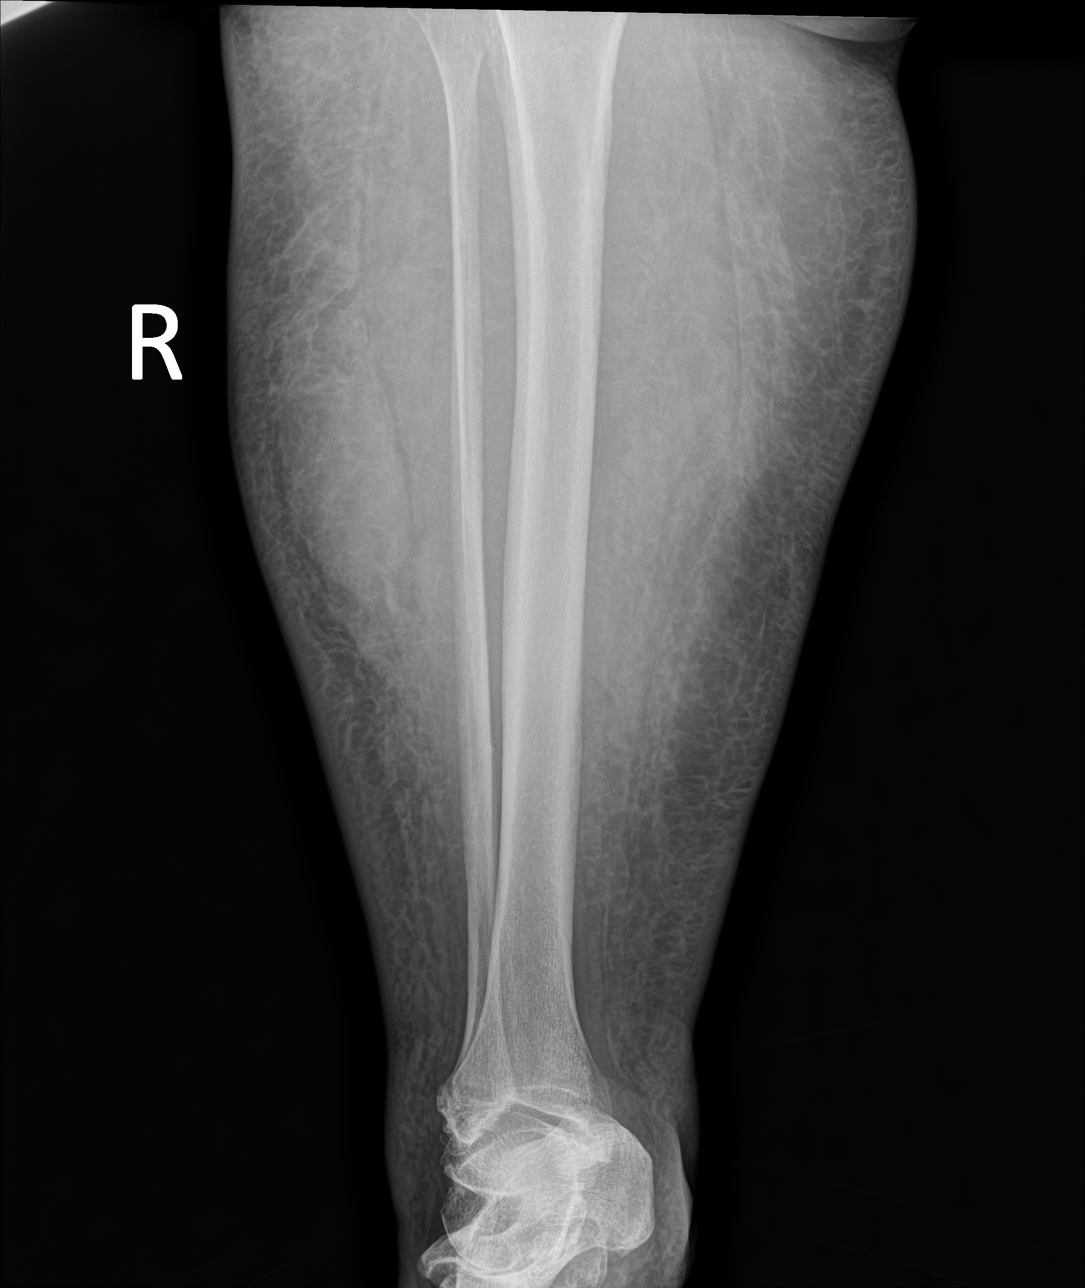
[im 3/4]
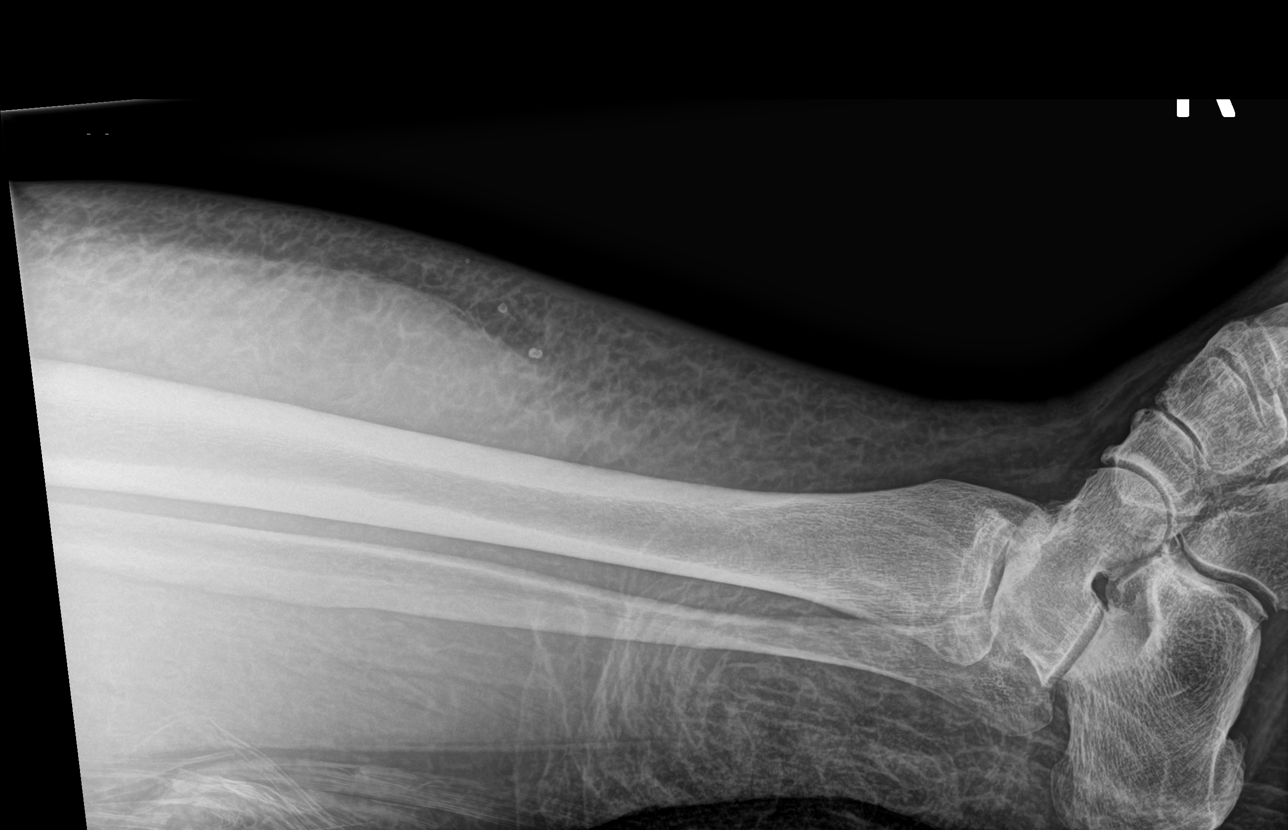
[im 4/4]
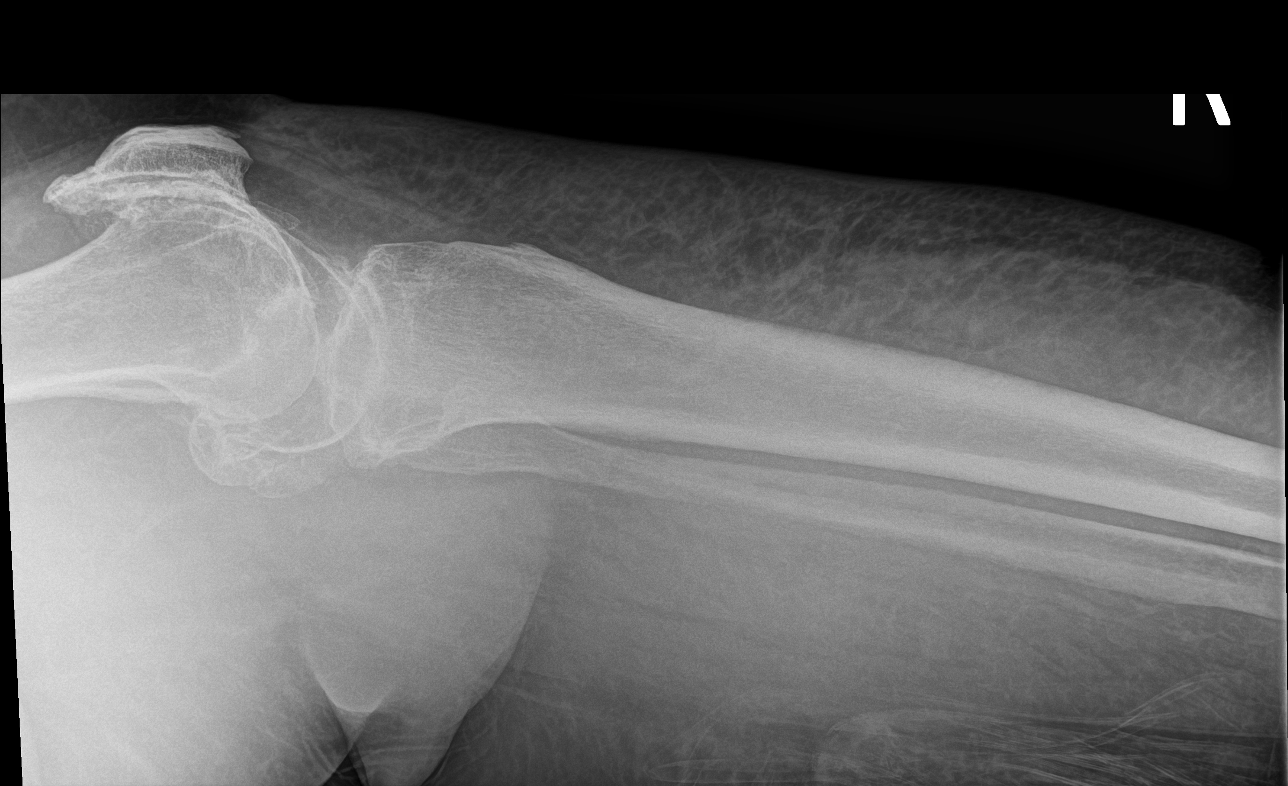

[4 of 4 positions shown; findings below may reference images not displayed]

FINDINGS: There is a suspected at least 12.8 x 3.5 cm hematoma involving the
subcutaneous tissues of the anterolateral aspect the right lower
leg. No associated fracture or dislocation. Scattered adjacent
dermal calcifications. No radiopaque foreign body. No subcutaneous
emphysema.

Limited visualization of the knee demonstrates severe
tricompartmental degenerative change, worse within the medial
compartment. Moderate to severe degenerative change is suspected of
the ankle though suboptimally evaluated due to obliquity.
IMPRESSION: 1. Suspected 12.8 cm hematoma involving the subcutaneous tissues
about the anterolateral aspect of the right lower leg without
associated fracture or radiopaque foreign body.
2. Severe tricompartmental degenerative change of the knee, worse
within medial compartment.
3. Moderate to severe degenerative change of the ankle is suspected
though incompletely evaluated.

## 2022-03-29 IMAGING — CT CT TIBIA FIBULA *R* W/O CM
3 series · 10 of 33 positions shown, 11 images · non-contrast
Comparison: X-ray right knee and tibia fibula 10/14/2020

CLINICAL DATA: Swelling and pain.  Injury to right leg.

EXAM:
CT OF THE LOWER RIGHT EXTREMITY WITHOUT CONTRAST
TECHNIQUE: Multidetector CT imaging of the right lower extremity was performed
according to the standard protocol.

[Series 5: axial soft · axial · 0.53mm/px · z∈[+108,+324]mm · 2 of 235 slices shown, 3 images]
[im 73/235  soft-tissue]
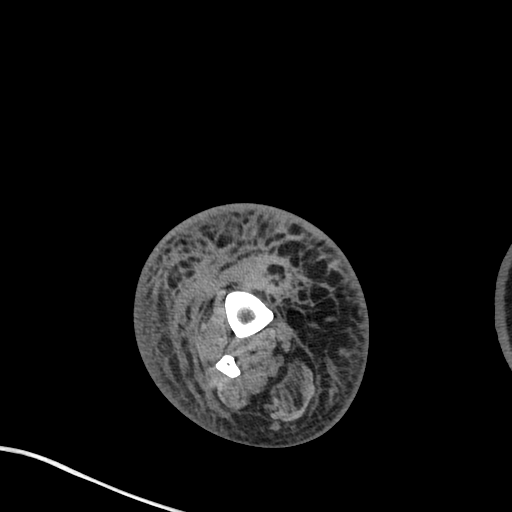
[im 73/235  bone]
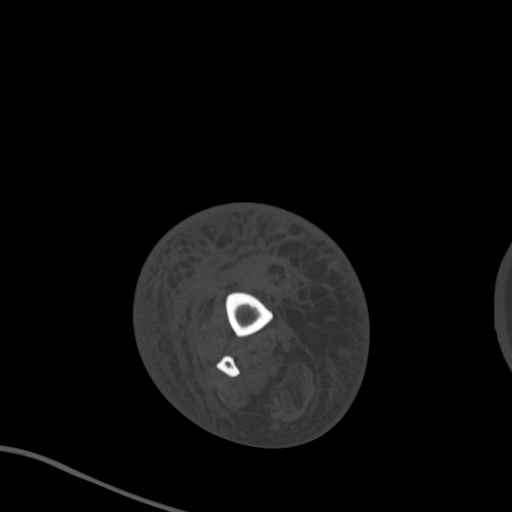
[im 181/235  bone]
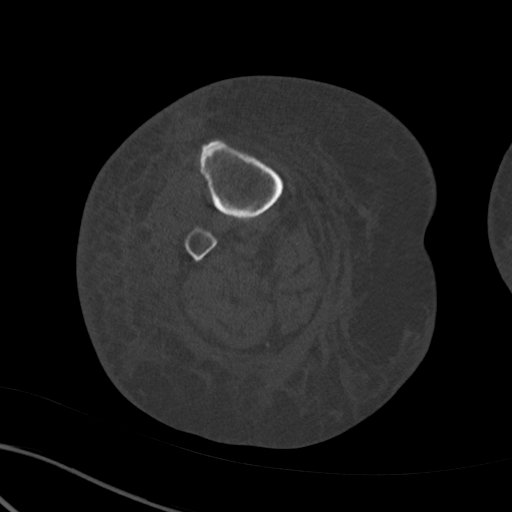

[Series 8: coronal soft · coronal · 0.63mm/px · 3 of 116 slices shown]
[im 38/116  bone]
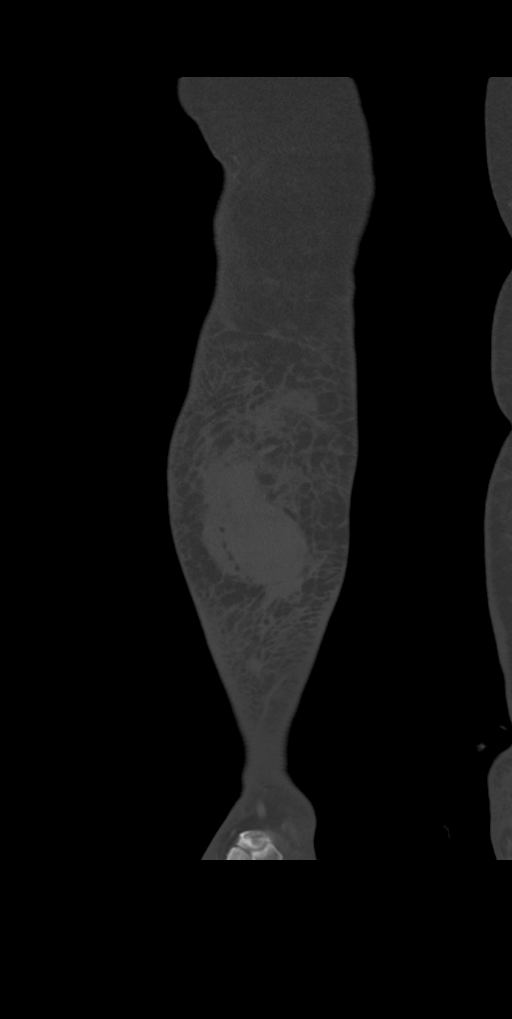
[im 51/116  bone]
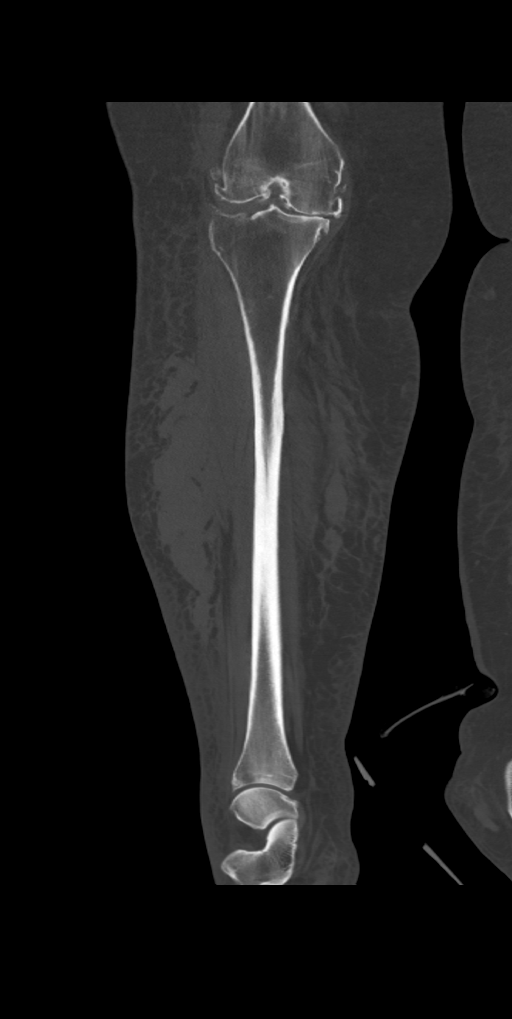
[im 65/116  bone]
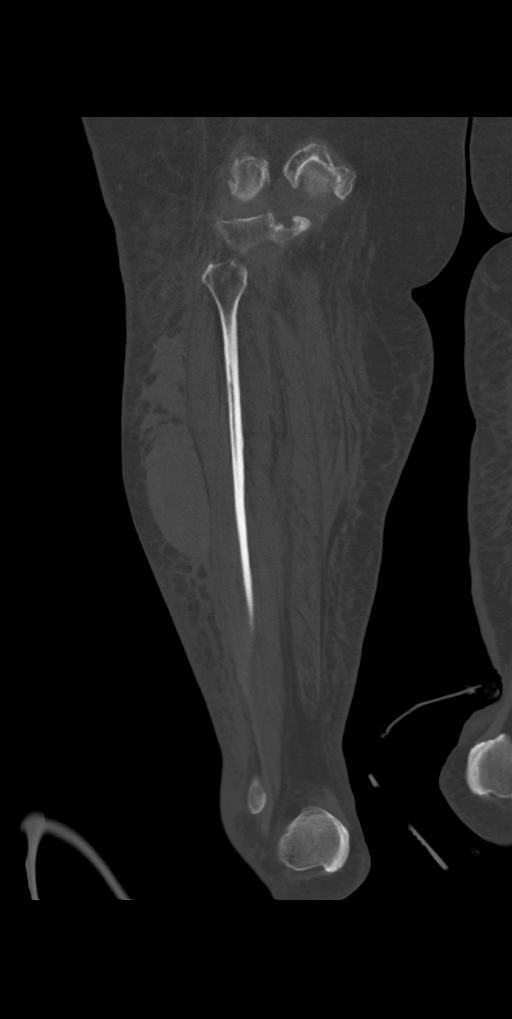

[Series 9: sagittal soft · sagittal · 0.59mm/px · 5 of 126 slices shown]
[im 42/126  bone]
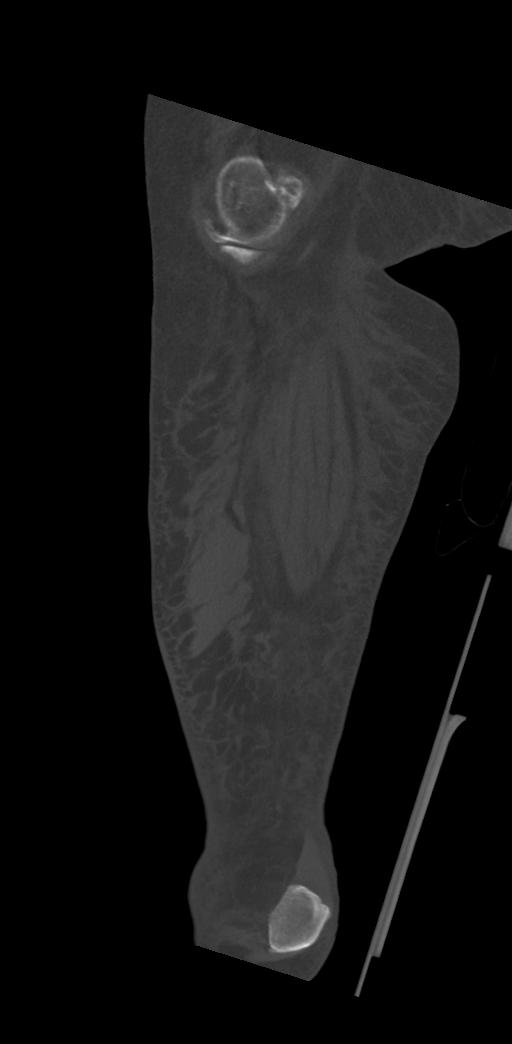
[im 53/126  bone]
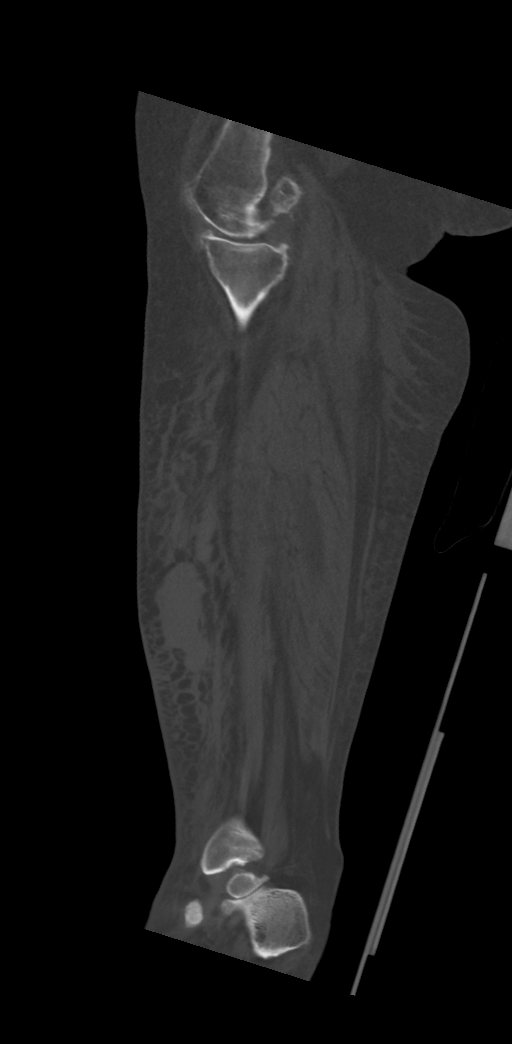
[im 63/126  bone]
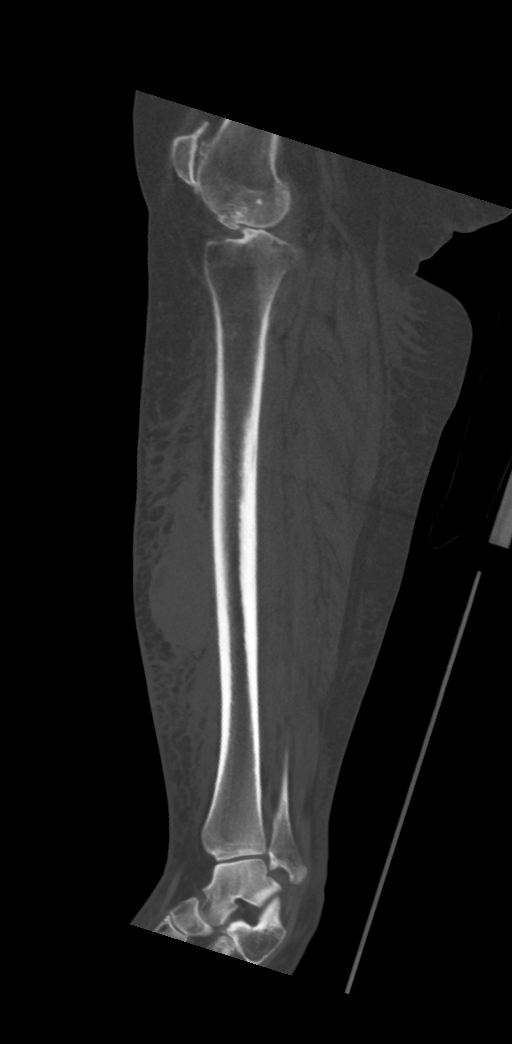
[im 73/126  bone]
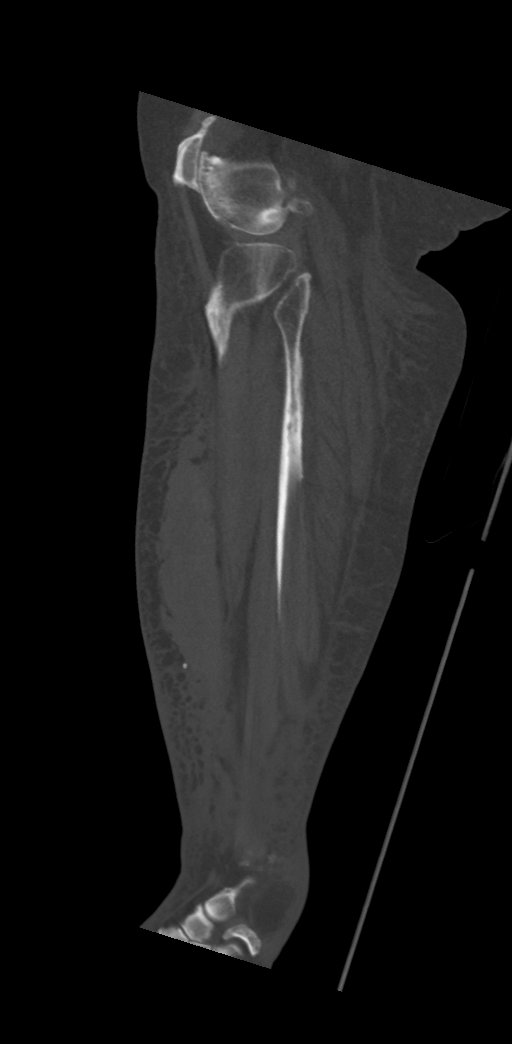
[im 84/126  bone]
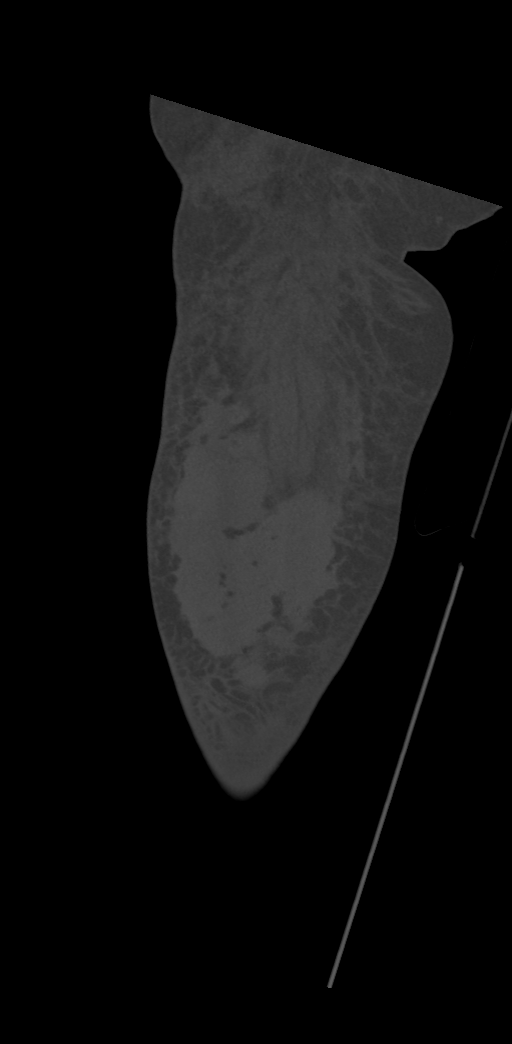

[10 of 33 positions shown; findings below may reference images not displayed]

FINDINGS: Bones/Joint/Cartilage

Severe tricompartmental degenerative changes of the right knee.
Lateral subluxation of the patella in relation to the patellofemoral
joint with impaction of the patella onto the and anterior right
femoral condyle. No cortical erosion. No acute displaced fracture or
dislocation of the bones of the leg including knee and ankle.

No large joint effusion.  No lipohemarthrosis identified.

Ligaments

Suboptimally assessed by CT.

Muscles and Tendons

Diffusely atrophic.

Soft tissues

Slightly heterogeneous, high density, lobulated subcutaneus soft
tissue lesion along the anterior and lateral soft tissues measuring
approximately 10 x 4 x 14 cm in centered along the mid leg.
Associated diffuse subcutaneus soft tissue edema and dermal
thickening. No subcutaneus soft tissue emphysema. No definite deep
fascial edema. No retained radiopaque foreign body.
IMPRESSION: 1. Approximally 10 x 4 x 14 cm subcutaneus soft tissue density along
the anterior and lateral mid leg likely representing a hematoma with
underlying mass lesion not excluded. Associated diffuse subcutaneus
soft tissue edema and dermal thickening. Superimposed infection not
excluded. Limited evaluation on this noncontrast study.
2. Severe tricompartmental degenerative changes of the right knee.
Lateral subluxation of the patella in relation to the patellofemoral
joint with impaction of the patella onto the and anterior right
femoral condyle.

## 2022-03-29 IMAGING — DX DG KNEE COMPLETE 4+V*R*
1 series · 4 of 4 positions shown · non-contrast
Comparison: Right tibia and fibular radiographs-earlier same day

CLINICAL DATA: Post fall, now with right knee pain.

EXAM:
RIGHT KNEE - COMPLETE 4+ VIEW

[Series 1: knee · 0.14mm/px · 4 of 4 slices shown]
[im 1/4]
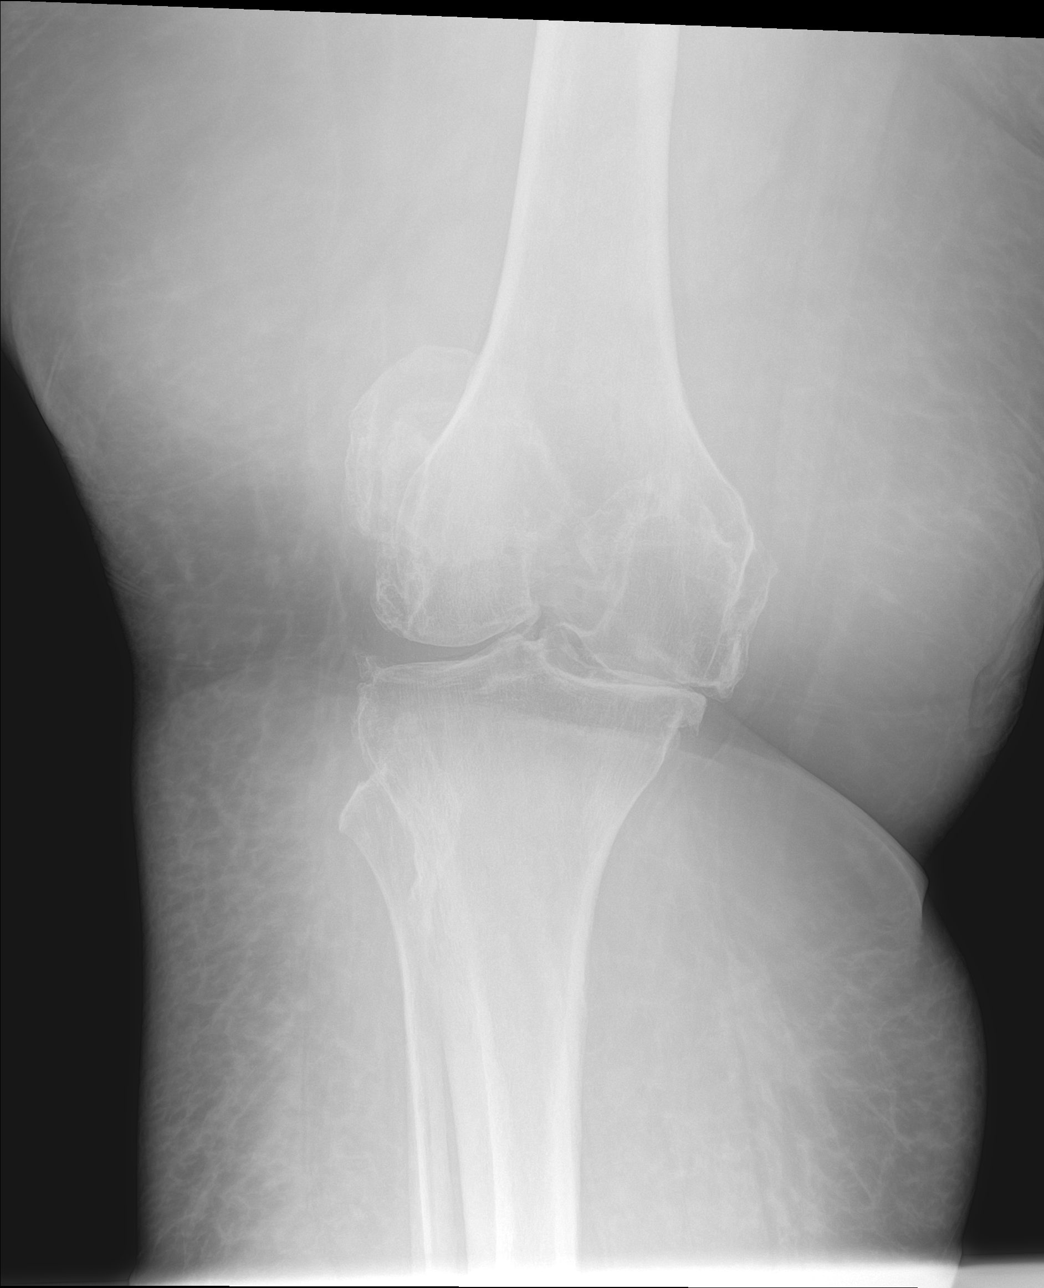
[im 2/4]
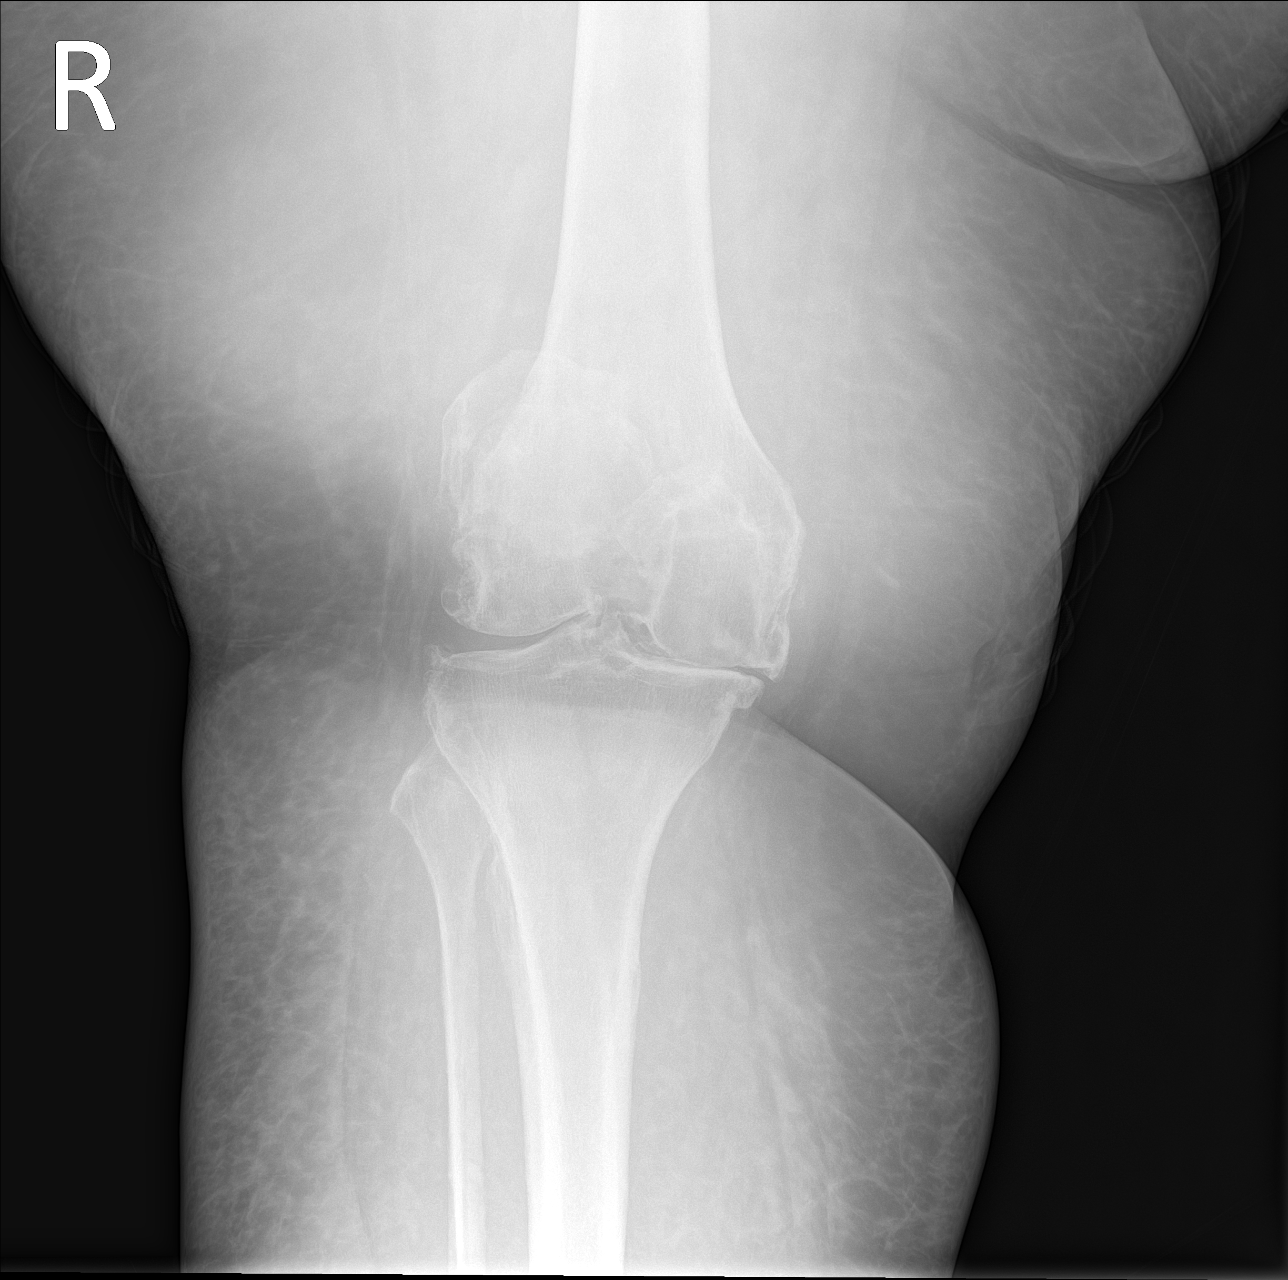
[im 3/4]
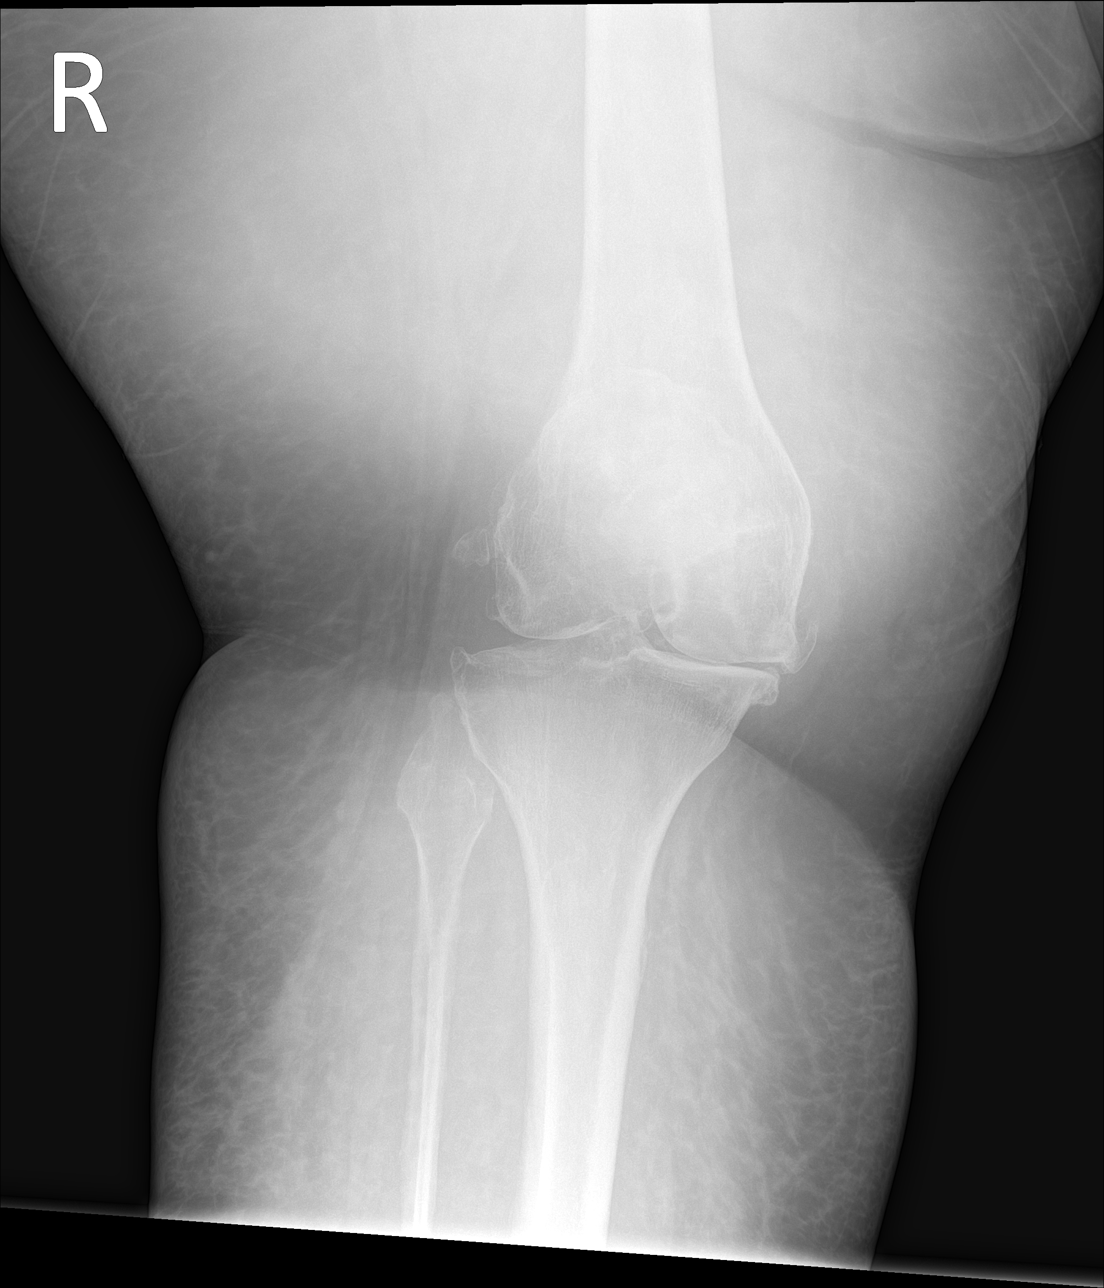
[im 4/4]
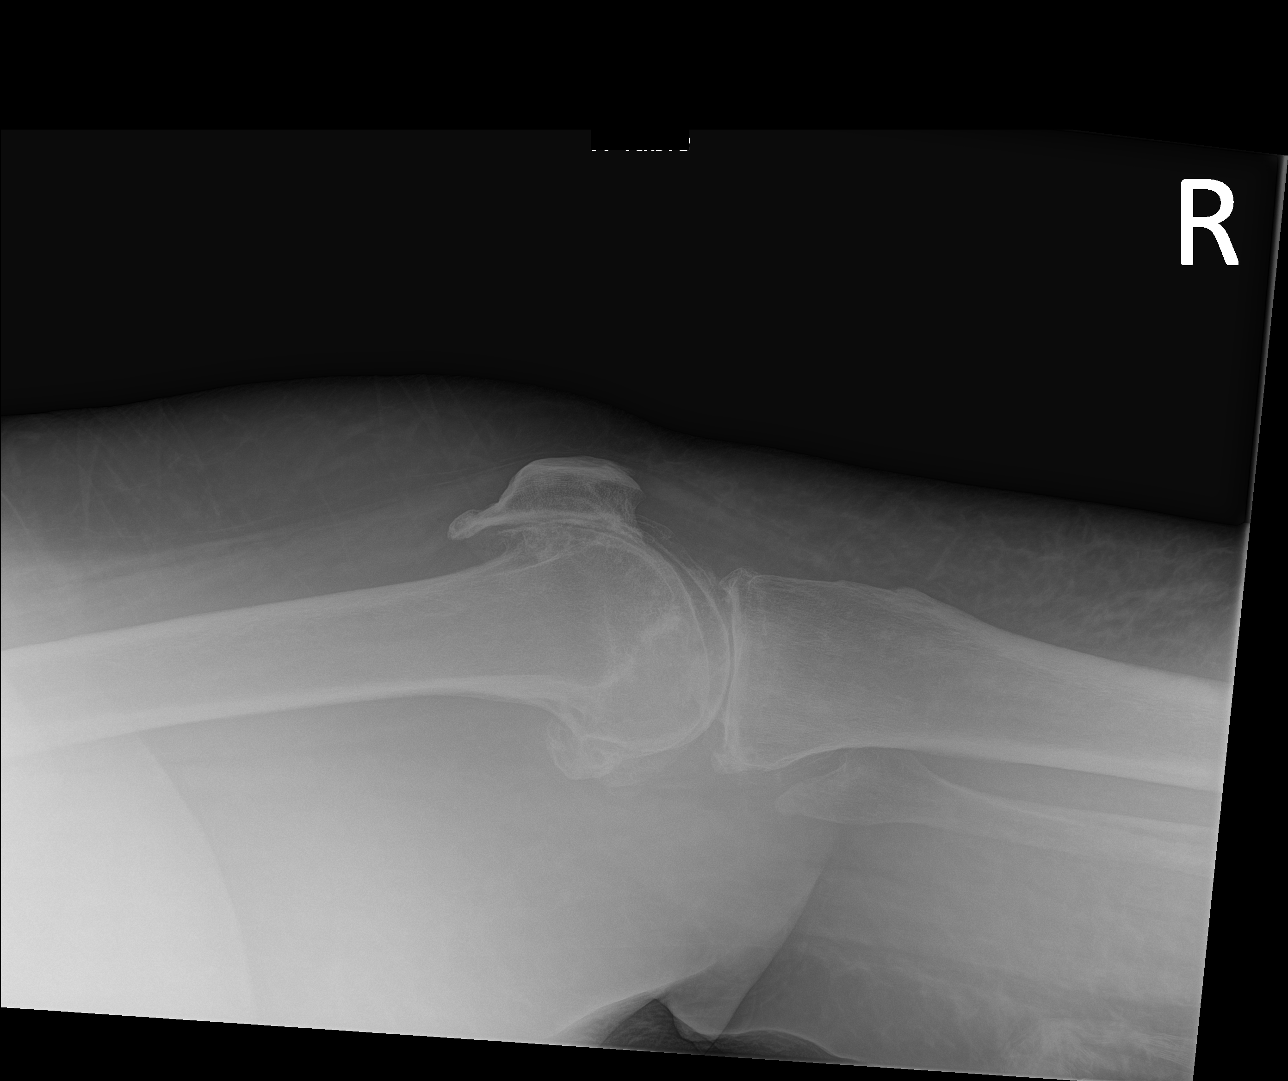

[4 of 4 positions shown; findings below may reference images not displayed]

FINDINGS: No fracture or dislocation. No joint effusion or evidence of
lipohemarthrosis

Severe tricompartmental degenerative change of knee, worse within
the medial compartment with complete joint space loss, bone-on-bone
articulation, subchondral sclerosis and osteophytosis. There is
minimal spurring of the tibial spines. No evidence of
chondrocalcinosis. No radiopaque foreign body.
IMPRESSION: 1. No fracture or dislocation.
2. Severe tricompartmental degenerative change of the knee, worse
within the medial compartment.

## 2022-04-20 DIAGNOSIS — H9313 Tinnitus, bilateral: Secondary | ICD-10-CM | POA: Diagnosis not present

## 2022-04-20 DIAGNOSIS — H903 Sensorineural hearing loss, bilateral: Secondary | ICD-10-CM | POA: Diagnosis not present

## 2022-06-28 DIAGNOSIS — E1140 Type 2 diabetes mellitus with diabetic neuropathy, unspecified: Secondary | ICD-10-CM | POA: Diagnosis not present

## 2022-06-28 DIAGNOSIS — Z7401 Bed confinement status: Secondary | ICD-10-CM | POA: Diagnosis not present

## 2022-06-28 DIAGNOSIS — R2689 Other abnormalities of gait and mobility: Secondary | ICD-10-CM | POA: Diagnosis not present

## 2022-06-28 DIAGNOSIS — E1142 Type 2 diabetes mellitus with diabetic polyneuropathy: Secondary | ICD-10-CM | POA: Diagnosis not present

## 2022-06-28 DIAGNOSIS — K047 Periapical abscess without sinus: Secondary | ICD-10-CM | POA: Diagnosis not present

## 2022-06-28 DIAGNOSIS — I890 Lymphedema, not elsewhere classified: Secondary | ICD-10-CM | POA: Diagnosis not present

## 2022-06-28 DIAGNOSIS — I872 Venous insufficiency (chronic) (peripheral): Secondary | ICD-10-CM | POA: Diagnosis not present

## 2022-06-28 DIAGNOSIS — E039 Hypothyroidism, unspecified: Secondary | ICD-10-CM | POA: Diagnosis not present

## 2022-06-28 DIAGNOSIS — R600 Localized edema: Secondary | ICD-10-CM | POA: Diagnosis not present

## 2022-06-28 DIAGNOSIS — G4733 Obstructive sleep apnea (adult) (pediatric): Secondary | ICD-10-CM | POA: Diagnosis not present

## 2022-06-30 DIAGNOSIS — E039 Hypothyroidism, unspecified: Secondary | ICD-10-CM | POA: Diagnosis not present

## 2022-06-30 DIAGNOSIS — M1990 Unspecified osteoarthritis, unspecified site: Secondary | ICD-10-CM | POA: Diagnosis not present

## 2022-06-30 DIAGNOSIS — I10 Essential (primary) hypertension: Secondary | ICD-10-CM | POA: Diagnosis not present

## 2022-06-30 DIAGNOSIS — E7800 Pure hypercholesterolemia, unspecified: Secondary | ICD-10-CM | POA: Diagnosis not present

## 2022-06-30 DIAGNOSIS — E1140 Type 2 diabetes mellitus with diabetic neuropathy, unspecified: Secondary | ICD-10-CM | POA: Diagnosis not present

## 2022-07-07 DIAGNOSIS — Z0000 Encounter for general adult medical examination without abnormal findings: Secondary | ICD-10-CM | POA: Diagnosis not present

## 2022-07-13 ENCOUNTER — Other Ambulatory Visit: Payer: Self-pay | Admitting: Family Medicine

## 2022-07-13 DIAGNOSIS — M25551 Pain in right hip: Secondary | ICD-10-CM | POA: Diagnosis not present

## 2022-07-13 DIAGNOSIS — R0989 Other specified symptoms and signs involving the circulatory and respiratory systems: Secondary | ICD-10-CM

## 2022-07-13 DIAGNOSIS — E1140 Type 2 diabetes mellitus with diabetic neuropathy, unspecified: Secondary | ICD-10-CM | POA: Diagnosis not present

## 2022-08-03 DIAGNOSIS — M25551 Pain in right hip: Secondary | ICD-10-CM | POA: Diagnosis not present

## 2022-08-10 DIAGNOSIS — M25551 Pain in right hip: Secondary | ICD-10-CM | POA: Diagnosis not present

## 2022-12-27 DIAGNOSIS — E1140 Type 2 diabetes mellitus with diabetic neuropathy, unspecified: Secondary | ICD-10-CM | POA: Diagnosis not present

## 2022-12-27 DIAGNOSIS — E039 Hypothyroidism, unspecified: Secondary | ICD-10-CM | POA: Diagnosis not present

## 2022-12-27 DIAGNOSIS — G4733 Obstructive sleep apnea (adult) (pediatric): Secondary | ICD-10-CM | POA: Diagnosis not present

## 2022-12-27 DIAGNOSIS — Z23 Encounter for immunization: Secondary | ICD-10-CM | POA: Diagnosis not present

## 2022-12-27 DIAGNOSIS — I872 Venous insufficiency (chronic) (peripheral): Secondary | ICD-10-CM | POA: Diagnosis not present

## 2022-12-27 DIAGNOSIS — E7800 Pure hypercholesterolemia, unspecified: Secondary | ICD-10-CM | POA: Diagnosis not present

## 2022-12-27 DIAGNOSIS — I890 Lymphedema, not elsewhere classified: Secondary | ICD-10-CM | POA: Diagnosis not present

## 2022-12-27 DIAGNOSIS — H547 Unspecified visual loss: Secondary | ICD-10-CM | POA: Diagnosis not present

## 2022-12-27 DIAGNOSIS — E1142 Type 2 diabetes mellitus with diabetic polyneuropathy: Secondary | ICD-10-CM | POA: Diagnosis not present

## 2022-12-27 DIAGNOSIS — R2689 Other abnormalities of gait and mobility: Secondary | ICD-10-CM | POA: Diagnosis not present

## 2022-12-27 DIAGNOSIS — I10 Essential (primary) hypertension: Secondary | ICD-10-CM | POA: Diagnosis not present

## 2023-02-18 ENCOUNTER — Other Ambulatory Visit (HOSPITAL_COMMUNITY): Payer: Self-pay | Admitting: Family Medicine

## 2023-02-18 DIAGNOSIS — R0989 Other specified symptoms and signs involving the circulatory and respiratory systems: Secondary | ICD-10-CM

## 2023-02-21 ENCOUNTER — Ambulatory Visit (HOSPITAL_COMMUNITY): Admission: RE | Admit: 2023-02-21 | Payer: Medicare Other | Source: Ambulatory Visit

## 2023-02-21 ENCOUNTER — Encounter (HOSPITAL_COMMUNITY): Payer: Self-pay

## 2023-02-28 ENCOUNTER — Ambulatory Visit (HOSPITAL_COMMUNITY): Payer: Medicare Other

## 2023-02-28 ENCOUNTER — Encounter (HOSPITAL_COMMUNITY): Payer: Self-pay

## 2023-03-16 ENCOUNTER — Ambulatory Visit (HOSPITAL_COMMUNITY)
Admission: RE | Admit: 2023-03-16 | Discharge: 2023-03-16 | Disposition: A | Discharge status: Home / Self Care | Payer: Medicare Other | Source: Ambulatory Visit | Attending: Family Medicine | Admitting: Family Medicine

## 2023-03-16 DIAGNOSIS — R0989 Other specified symptoms and signs involving the circulatory and respiratory systems: Secondary | ICD-10-CM | POA: Insufficient documentation

## 2023-03-16 LAB — VAS US ABI WITH/WO TBI
Left ABI: 1.23
Right ABI: 1.45

## 2023-04-12 DIAGNOSIS — E1140 Type 2 diabetes mellitus with diabetic neuropathy, unspecified: Secondary | ICD-10-CM | POA: Diagnosis not present

## 2023-04-12 DIAGNOSIS — E7800 Pure hypercholesterolemia, unspecified: Secondary | ICD-10-CM | POA: Diagnosis not present

## 2023-06-28 DIAGNOSIS — H59811 Chorioretinal scars after surgery for detachment, right eye: Secondary | ICD-10-CM | POA: Diagnosis not present

## 2023-06-28 DIAGNOSIS — H26493 Other secondary cataract, bilateral: Secondary | ICD-10-CM | POA: Diagnosis not present

## 2023-06-28 DIAGNOSIS — E119 Type 2 diabetes mellitus without complications: Secondary | ICD-10-CM | POA: Diagnosis not present

## 2023-06-28 DIAGNOSIS — H35341 Macular cyst, hole, or pseudohole, right eye: Secondary | ICD-10-CM | POA: Diagnosis not present

## 2023-06-28 DIAGNOSIS — H43813 Vitreous degeneration, bilateral: Secondary | ICD-10-CM | POA: Diagnosis not present

## 2023-06-28 DIAGNOSIS — H526 Other disorders of refraction: Secondary | ICD-10-CM | POA: Diagnosis not present

## 2023-06-28 DIAGNOSIS — H31013 Macula scars of posterior pole (postinflammatory) (post-traumatic), bilateral: Secondary | ICD-10-CM | POA: Diagnosis not present

## 2023-07-12 DIAGNOSIS — H353112 Nonexudative age-related macular degeneration, right eye, intermediate dry stage: Secondary | ICD-10-CM | POA: Diagnosis not present

## 2023-07-12 DIAGNOSIS — H353223 Exudative age-related macular degeneration, left eye, with inactive scar: Secondary | ICD-10-CM | POA: Diagnosis not present

## 2023-07-12 DIAGNOSIS — H31093 Other chorioretinal scars, bilateral: Secondary | ICD-10-CM | POA: Diagnosis not present

## 2023-07-12 DIAGNOSIS — H35371 Puckering of macula, right eye: Secondary | ICD-10-CM | POA: Diagnosis not present

## 2023-07-18 DIAGNOSIS — I739 Peripheral vascular disease, unspecified: Secondary | ICD-10-CM | POA: Diagnosis not present

## 2023-07-18 DIAGNOSIS — E1140 Type 2 diabetes mellitus with diabetic neuropathy, unspecified: Secondary | ICD-10-CM | POA: Diagnosis not present

## 2023-07-18 DIAGNOSIS — M1990 Unspecified osteoarthritis, unspecified site: Secondary | ICD-10-CM | POA: Diagnosis not present

## 2023-07-18 DIAGNOSIS — R2689 Other abnormalities of gait and mobility: Secondary | ICD-10-CM | POA: Diagnosis not present

## 2023-07-18 DIAGNOSIS — I10 Essential (primary) hypertension: Secondary | ICD-10-CM | POA: Diagnosis not present

## 2023-07-18 DIAGNOSIS — E7800 Pure hypercholesterolemia, unspecified: Secondary | ICD-10-CM | POA: Diagnosis not present

## 2023-07-18 DIAGNOSIS — K047 Periapical abscess without sinus: Secondary | ICD-10-CM | POA: Diagnosis not present

## 2023-07-18 DIAGNOSIS — E039 Hypothyroidism, unspecified: Secondary | ICD-10-CM | POA: Diagnosis not present

## 2023-07-20 ENCOUNTER — Telehealth: Payer: Self-pay

## 2023-07-20 DIAGNOSIS — I10 Essential (primary) hypertension: Secondary | ICD-10-CM

## 2023-07-20 DIAGNOSIS — E1169 Type 2 diabetes mellitus with other specified complication: Secondary | ICD-10-CM

## 2023-08-01 ENCOUNTER — Telehealth: Payer: Self-pay | Admitting: *Deleted

## 2023-08-01 DIAGNOSIS — Z961 Presence of intraocular lens: Secondary | ICD-10-CM | POA: Diagnosis not present

## 2023-08-01 DIAGNOSIS — H524 Presbyopia: Secondary | ICD-10-CM | POA: Diagnosis not present

## 2023-08-01 NOTE — Progress Notes (Unsigned)
 Complex Care Management Note Care Guide Note  08/01/2023 Name: Maydelin Bartolome MRN: [id number] DOB: 03-27-1955   Complex Care Management Outreach Attempts: An unsuccessful telephone outreach was attempted today to offer the patient information about available complex care management services.  Follow Up Plan:  Additional outreach attempts will be made to offer the patient complex care management information and services.   Encounter Outcome:  No Answer  Barnie Bora  [facility]  [facility] Guide  Direct Dial: [phone]  Fax [phone]

## 2023-08-02 NOTE — Progress Notes (Signed)
 Complex Care Management Note  Care Guide Note 08/02/2023 Name: Keashia Buesing MRN: [id number] DOB: April 29, 1955  Aubrea Demonte is a 68 y.o. year old female who sees Helyn Lobstein, MD for primary care. I reached out to [facility] by phone today to offer complex care management services.  Ms. Lipson was given information about Complex Care Management services today including:   The Complex Care Management services include support from the care team which includes your Nurse Care Manager, Clinical Social Worker, or Pharmacist.  The Complex Care Management team is here to help remove barriers to the health concerns and goals most important to you. Complex Care Management services are voluntary, and the patient may decline or stop services at any time by request to their care team member.   Complex Care Management Consent Status: Patient agreed to services and verbal consent obtained.   Follow up plan:  Telephone appointment with complex care management team member scheduled for:  08/18/23  Encounter Outcome:  Patient Scheduled  Barnie Bora   [facility]  [facility], [facility] Guide  Direct Dial: [phone]  Fax [phone]

## 2023-08-03 ENCOUNTER — Telehealth: Payer: Self-pay

## 2023-08-03 NOTE — Progress Notes (Signed)
   Telephone encounter was:  Successful.  Complex Care Management Note Care Guide Note  08/03/2023 Name: Whitney Blackburn MRN: [id number] DOB: [birth year]  [name] is a 68 y.o. year old female who is a primary care patient of Helyn Lobstein, MD . The community resource team was consulted for assistance with Transportation Needs   SDOH screenings and interventions completed:  Yes        Care guide performed the following interventions: Patient provided with information about care guide support team and interviewed to confirm resource needs.Pt is requesting transportation resources for bariatric wheelchair needs. Pt has difficulties getting to doctor appointments Resources was given over the phone  [employer] Phone: [phone] Address: [address] [place], [place] [address]   Follow Up Plan:  No further follow up planned at this time. The patient has been provided with needed resources.  Encounter Outcome:  Patient Visit Completed    Azell [facility]  [facility] Guide, Phone: [phone] Fax: [phone] Website: [email or web address]

## 2023-08-15 DIAGNOSIS — E7800 Pure hypercholesterolemia, unspecified: Secondary | ICD-10-CM | POA: Diagnosis not present

## 2023-08-15 DIAGNOSIS — E039 Hypothyroidism, unspecified: Secondary | ICD-10-CM | POA: Diagnosis not present

## 2023-08-15 DIAGNOSIS — M1990 Unspecified osteoarthritis, unspecified site: Secondary | ICD-10-CM | POA: Diagnosis not present

## 2023-08-18 ENCOUNTER — Other Ambulatory Visit: Payer: Self-pay

## 2023-08-18 DIAGNOSIS — Z139 Encounter for screening, unspecified: Secondary | ICD-10-CM

## 2023-08-18 NOTE — Patient Instructions (Signed)
 Visit Information  Thank you for taking time to visit with me today. Please don't hesitate to contact me if I can be of assistance to you before our next scheduled appointment.  Our next appointment is by telephone on 08/25/23 at 1:30 PM Please call the care guide team at [phone] if you need to cancel or reschedule your appointment.   Following is a copy of your care plan:   Goals Addressed             This Visit's Progress    VBCI RN Care Plan   On track    Problems:  Care Coordination needs related to Financial Strain  Chronic Disease Management support and education needs related to chronic conditions Lack of social support  Goal: Over the next 30 days the Patient will continue to work with Medical illustrator and/or Social Worker to address care management and care coordination needs related to transportation, financial strain as evidenced by adherence to care management team scheduled appointments     demonstrate Ongoing health management independence as evidenced by performing ADLs independently, no reported falls        take all medications exactly as prescribed and will call provider for medication related questions as evidenced by patient report of medication adherence     Interventions:   Falls Interventions: Advised patient of importance of notifying provider of falls Screening for signs and symptoms of depression related to chronic disease state  Assessed social determinant of health barriers   Evaluation of current treatment plan related to DMII and HTN, Financial constraints related to chronic disease management and Transportation self-management and patient's adherence to plan as established by provider. Discussed plans with patient for ongoing care management follow up and provided patient with direct contact information for care management team Provided patient and/or caregiver with emailed information about local medical supply stores for hospital bed and  low-cost hearing aid resources (community resource) Reviewed scheduled/upcoming provider appointments including Vein/vascular 09/13/23 Social Work referral for additional assistance regarding SDOH needs (food, transportation), low-cost dental care, DME, etc Discussed plans with patient for ongoing care management follow up and provided patient with direct contact information for care management team  Patient Self-Care Activities:  Attend all scheduled provider appointments Call provider office for new concerns or questions  Perform all self care activities independently  Take medications as prescribed   Work with the social worker to address care coordination needs and will continue to work with the clinical team to address health care and disease management related needs  Plan:  Telephone follow up appointment with care management team member scheduled for:  08/25/23 at 1:30 PM to complete initial assessment (med rec) Appointment with BSW scheduled 09/01/23 at 2:30 PM             Please call the Suicide and Crisis Lifeline: 988 call 1-800-273-TALK (toll free, 24 hour hotline) if you are experiencing a Mental Health or Behavioral Health Crisis or need someone to talk to.  Patient verbalizes understanding of instructions and care plan provided today and agrees to view in MyChart. Active MyChart status and patient understanding of how to access instructions and care plan via MyChart confirmed with patient.   Patient will try to log in to her MyChart to review instructions. Information regarding resources sent via email. Email verified with patient.  Rosaline Finlay, RN MSN   VBCI Population Health RN Care Manager Direct Dial: [phone]  Fax: [phone]

## 2023-08-18 NOTE — Patient Outreach (Signed)
 Complex Care Management   Visit Note  08/18/2023  Name:  Whitney Blackburn MRN: [id number] DOB: 04-11-1955  Situation: Referral received for Complex Care Management related to SDOH Barriers:  Transportation and decreased mobility, lack of social support I obtained verbal consent from Patient.  Visit completed with [facility]  on the phone  Background:   Past Medical History:  Diagnosis Date   Allergy    Anemia    Arthritis    Cataracts, bilateral    Chicken pox    Depression    Eating disorder    1980's   Fainting spell 1980's   noted in 1980's   Glaucoma    Heart murmur    Hyperlipidemia    Hypertension    noted high readings in the past, per has not been to MD in 3 years   Macular degeneration    Skin rash    Ulcer 1980    Assessment: Patient Reported Symptoms:  Cognitive Cognitive Status: Able to follow simple commands, Alert and oriented to person, place, and time, Normal speech and language skills Cognitive/Intellectual Conditions Management [RPT]: None reported or documented in medical history or problem list   Health Maintenance Behaviors: Annual physical exam  Neurological Neurological Review of Symptoms: Hearing changes, Vision changes Neurological Management Strategies: Routine screening, Coping strategies  HEENT HEENT Symptoms Reported: Other: (Missing several teeth including a front tooth. Fillings in the back have come out and are bothering her. She reports this is leading to ear infections and jaw pain. PCP has advised locating a community dental clinic for lower cost care.) HEENT Management Strategies: Medical device    Cardiovascular Cardiovascular Symptoms Reported: Dizziness, Swelling in legs or feet (Dizziness with standing. Swelling due to lymphedema not as bad as it used to be) Does patient have uncontrolled Hypertension?: No (Confirmed patient does have BP cuff at home) Cardiovascular Management Strategies: Medication therapy, Routine  screening Cardiovascular Comment: Note that patient is scheduled for ABIs 09/13/23. She was unaware of this appointment. Provided with office contact number.  Respiratory Respiratory Symptoms Reported: No symptoms reported Additional Respiratory Details: Patient has been diagnosed with sleep apnea. She does not wear a CPAP due to feeling as if the machine did not help her it made me feel like I couldn't breathe. Does not have a CPAP.    Endocrine Endocrine Symptoms Reported: No symptoms reported Is patient diabetic?: Yes Is patient checking blood sugars at home?: No (Confirmed that patient does have a monitor to check her sugar if needed) Endocrine Comment: Most recent A1c 5.5% per chart review  Gastrointestinal Gastrointestinal Symptoms Reported: No symptoms reported Additional Gastrointestinal Details: Patient reports a good appetite. Last BM yesterday.      Genitourinary Genitourinary Symptoms Reported: Incontinence (incontinent for 30 years per patient) Additional Genitourinary Details: Patient reports she tried medications for incontinence when it first began. She was told incontinence was related to her weight. Genitourinary Management Strategies: Incontinence garment/pad  Integumentary Integumentary Symptoms Reported: Other Other Integumentary Symptoms: Patient reports she develops rashes in the folds of her skin. She does her best to keep skin clean and dry to prevent irritation. Skin Management Strategies: Coping strategies  Musculoskeletal Musculoskelatal Symptoms Reviewed: Difficulty walking, Unsteady gait, Other Other Musculoskeletal Symptoms: Lymphedema Musculoskeletal Management Strategies: Adequate rest, Medical device Falls in the past year?: Yes Number of falls in past year: 2 or more Was there an injury with Fall?: No Fall Risk Category Calculator: 2 Patient Fall Risk Level: Moderate Fall Risk Patient at Risk for  Falls Due to: History of fall(s), Impaired  balance/gait Fall risk Follow up: Falls evaluation completed, Education provided, Falls prevention discussed  Psychosocial Psychosocial Symptoms Reported: Alteration in sleep habits (Alteration in sleep habits due to pain) Behavioral Management Strategies: Coping strategies, Support system Major Change/Loss/Stressor/Fears (CP): Medical condition, self Techniques to Cope with Loss/Stress/Change: Diversional activities Quality of Family Relationships: helpful, involved, supportive Do you feel physically threatened by others?: No      08/18/2023   10:05 AM  Depression screen PHQ 2/9  Decreased Interest 0  Down, Depressed, Hopeless 1  PHQ - 2 Score 1    There were no vitals filed for this visit.  Medications Reviewed Today   Medications were not reviewed in this encounter     Recommendation:   Continue Current Plan of Care Resources will be emailed to patient for her to review  Follow Up Plan:   Telephone follow up appointment date/time:  08/25/23 at 1:30 PM Referral to BSW. Scheduled 09/01/23 at 2:30 PM  Rosaline Finlay, RN MSN [place]  [facility] RN Care Manager Direct Dial: [phone]  Fax: [phone]

## 2023-08-25 ENCOUNTER — Other Ambulatory Visit: Payer: Self-pay

## 2023-08-25 NOTE — Patient Instructions (Signed)
 Visit Information  Thank you for taking time to visit with me today. Please don't hesitate to contact me if I can be of assistance to you before our next scheduled appointment.  Your next care management appointment is by telephone on 09/15/23 at 2 PM  Please call the care guide team at [phone] if you need to cancel, schedule, or reschedule an appointment.   Please call the Suicide and Crisis Lifeline: 988 call 1-800-273-TALK (toll free, 24 hour hotline) if you are experiencing a Mental Health or Behavioral Health Crisis or need someone to talk to.  Rosaline Finlay, RN MSN [place]  VBCI Population Health RN Care Manager Direct Dial: [phone]  Fax: [phone]

## 2023-08-25 NOTE — Patient Outreach (Signed)
 Complex Care Management   Visit Note  08/25/2023  Name:  Whitney Blackburn MRN: [id number] DOB: 12/18/1955  Situation: Referral received for Complex Care Management related to SDOH Barriers:  Transportation and decreased mobility, lack of social support I obtained verbal consent from Patient.  Visit completed with [facility]  on the phone  Background:   Past Medical History:  Diagnosis Date   Allergy    Anemia    Arthritis    Cataracts, bilateral    Chicken pox    Depression    Eating disorder    1980's   Fainting spell 1980's   noted in 1980's   Glaucoma    Heart murmur    Hyperlipidemia    Hypertension    noted high readings in the past, per has not been to MD in 3 years   Macular degeneration    Skin rash    Ulcer 1980    Assessment: Patient Reported Symptoms:  Cognitive Cognitive Status: Able to follow simple commands, Alert and oriented to person, place, and time, Normal speech and language skills, Struggling with memory recall (Struggling with memory recall related to finding the word and remembering names) Cognitive/Intellectual Conditions Management [RPT]: None reported or documented in medical history or problem list      Neurological Neurological Review of Symptoms: Not assessed    HEENT HEENT Symptoms Reported: Not assessed      Cardiovascular Cardiovascular Symptoms Reported: Not assessed Cardiovascular Comment: Patient reports that she is not interested in going to appointment with vascular 09/13/23. She reports she had ABIs done in January and nothing came of it. Call placed to PCP office to notify.  Respiratory Respiratory Symptoms Reported: Not assesed    Endocrine Endocrine Symptoms Reported: Not assessed    Gastrointestinal Gastrointestinal Symptoms Reported: Not assessed      Genitourinary Genitourinary Symptoms Reported: Not assessed    Integumentary Integumentary Symptoms Reported: Not assessed Skin Comment: Patient notes that she has not  had a mammogram since 2009. She is wondering if she needs to have one done and where to go. Call placed to PCP office to notify of patient request/question.  Musculoskeletal Musculoskelatal Symptoms Reviewed: Not assessed        Psychosocial Psychosocial Symptoms Reported: Not assessed            08/18/2023   10:05 AM  Depression screen PHQ 2/9  Decreased Interest 0  Down, Depressed, Hopeless 1  PHQ - 2 Score 1    There were no vitals filed for this visit.  Medications Reviewed Today     Reviewed by Arno Rosaline SQUIBB, RN (Registered Nurse) on 08/25/23 at 1350  Med List Status: <None>   Medication Order Taking? Sig Documenting Provider Last Dose Status Informant  acetaminophen  (TYLENOL ) 500 MG tablet [id number] Yes Take 500 mg by mouth every 6 (six) hours as needed. [provider]  Active Self           Med Note LEONARDO, [facility] L   Tue Oct 14, 2020  3:21 PM) Takes 2 tablets every morning and 2 tablets every evening  atorvastatin  (LIPITOR) 80 MG tablet [id number]  Take 80 mg by mouth daily.  Patient not taking: Reported on 08/25/2023   [provider]  Consider Medication Status and Discontinue (Discontinued by provider) Self  DULoxetine  (CYMBALTA ) 30 MG capsule [id number] Yes Take 90 mg by mouth at bedtime. Takes 30mg  and 60mg  at night [provider]  Active Self  ezetimibe  (ZETIA ) 10 MG tablet [id number]  Yes Take 10 mg by mouth daily. [provider]  Active Self  famotidine (PEPCID) 20 MG tablet [id number] Yes Take 20 mg by mouth daily. [provider]  Active Self  glipiZIDE (GLUCOTROL XL) 10 MG 24 hr tablet [id number]  Take 10 mg by mouth daily with breakfast.  Patient not taking: Reported on 08/25/2023   [provider]  Consider Medication Status and Discontinue (Discontinued by provider) Self  hydrochlorothiazide  (HYDRODIURIL ) 25 MG tablet [id number]  Take 25 mg by mouth daily.  Patient not taking: Reported on 08/25/2023    [provider]  Consider Medication Status and Discontinue (Discontinued by provider) Self  levothyroxine  (SYNTHROID , LEVOTHROID) 100 MCG tablet [id number] Yes Take 1 tablet (100 mcg total) by mouth daily. Tabori, Katherine E, MD  Active Self  lisinopril  (PRINIVIL ,ZESTRIL ) 40 MG tablet [id number] Yes Take 40 mg by mouth daily. [provider]  Active Self  Melatonin 3 MG TABS [id number] Yes Take 3 mg by mouth. [provider]  Active Self  metFORMIN  (GLUCOPHAGE -XR) 500 MG 24 hr tablet [id number] Yes Take 500 mg by mouth daily. 2 tabs once a day [provider]  Active Self  metoprolol  succinate (TOPROL -XL) 25 MG 24 hr tablet [id number] Yes Take 50 mg by mouth daily. [provider]  Active Self  Multiple Vitamins-Minerals (CENTRUM SILVER 50+WOMEN PO) [id number] Yes Take by mouth. [provider]  Active Self  Potassium 99 MG TABS [id number] Yes Take 99 mg by mouth. [provider]  Active Self  rosuvastatin (CRESTOR) 40 MG tablet [id number] Yes Take 40 mg by mouth daily. Aisha Harvey, MD  Active   tirzepatide [facility]) 5 MG/0.5ML Pen [id number]  Inject 5 mg into the skin once a week.  Patient not taking: Reported on 08/25/2023   Aisha Harvey, MD  Active   Vitamin D, Ergocalciferol, (DRISDOL) 1.25 MG (50000 UNIT) CAPS capsule [id number]  Take 50,000 Units by mouth every 7 (seven) days. [provider]  Active Self            Recommendation:   Diagnostic requests: Patient is requesting PCP guidance regarding need for mammogram. Message left with staff at PCP office Continue Current Plan of Care  Follow Up Plan:   Telephone follow up appointment date/time:  09/15/23 at 2 PM  Rosaline Finlay, RN MSN [place]  [facility] RN Care Manager Direct Dial: [phone]  Fax: [phone]

## 2023-08-30 ENCOUNTER — Other Ambulatory Visit: Payer: Self-pay | Admitting: Vascular Surgery

## 2023-08-30 DIAGNOSIS — M79606 Pain in leg, unspecified: Secondary | ICD-10-CM

## 2023-09-01 ENCOUNTER — Other Ambulatory Visit: Payer: Self-pay | Admitting: Licensed Clinical Social Worker

## 2023-09-01 NOTE — Patient Outreach (Addendum)
 Complex Care Management   Visit Note  09/01/2023  Name:  Whitney Blackburn MRN: [id number] DOB: [birth year]  Situation: Referral received for Complex Care Management related to SDOH Barriers:  Transportation Dental and Ucard  I obtained verbal consent from Patient.  Visit completed with patient  on the phone  Background:   Past Medical History:  Diagnosis Date   Allergy    Anemia    Arthritis    Cataracts, bilateral    Chicken pox    Depression    Eating disorder    1980's   Fainting spell 1980's   noted in 1980's   Glaucoma    Heart murmur    Hyperlipidemia    Hypertension    noted high readings in the past, per has not been to MD in 3 years   Macular degeneration    Skin rash    Ulcer 1980    Assessment: Patient will call [facility] and discuss dental and the U-card and to see if transportation is going to be covered again since they stopped it in January.  Administrator, Civil Service Age 44 & older [phone] (HP) [phone] (GSB) [facility] for non-medical rides,  Age 60 or older  [phone]  SW will also mail information on SCAT    SDOH Interventions    Flowsheet Row Patient Outreach Telephone from 09/01/2023 in [place] POPULATION HEALTH DEPARTMENT Patient Outreach Telephone from 08/18/2023 in [place] POPULATION HEALTH DEPARTMENT Telephone from 08/03/2023 in [place] POPULATION HEALTH DEPARTMENT  SDOH Interventions     Food Insecurity Interventions Intervention Not Indicated AMB Referral  [Patient reports she does not have issues affording food, but difficulty obtaining due to transportation. Patient orders groceries online and purchases using money from her 401K. Unsure if she would qualify for food stamps. BSW referral.] --  Housing Interventions Intervention Not Indicated Intervention Not Indicated --  Transportation Interventions Patient Resources (Friends/Family), SCAT (Specialized Community Area Transporation)  [Wants to review  the SCAT information] Other (Comment)  [Patient has been provided resources from care guide. BSW appt scheduled 09/01/23] Community Resources Provided  Utilities Interventions Intervention Not Indicated Intervention Not Indicated --      Recommendation:   none  Follow Up Plan:   Telephone follow up appointment date/time:  09/22/2023 at 2:30 pm  [name] [name] [name], PhD [facility], [facility] Social Worker Direct Dial: [phone]  Fax: [phone]

## 2023-09-01 NOTE — Patient Instructions (Signed)
 Visit Information  Thank you for taking time to visit with me today. Please don't hesitate to contact me if I can be of assistance to you before our next scheduled appointment.  Our next appointment is by telephone on 09/22/2023 at 2:30 pm Please call the care guide team at [phone] if you need to cancel or reschedule your appointment.   Following is a copy of your care plan:   Goals Addressed             This Visit's Progress    BSW VBCI Social Work Care Plan       Problems:   Transportation and low cost dental   CSW Clinical Goal(s):   Over the next 3 weeks the Patient will will follow up with [facility] as directed by Social Work.  Interventions:  SW will mail the SCAT information to the patient SW provided in the letter the following: [employer] Medical Transportation Age 29 & older [phone] (HP) [phone] (GSB) [facility] [place] for non-medical rides,  Age 23 or older  [phone]  Patient Goals/Self-Care Activities:  Follow up on insurance options for low cost dental and transportation and U-Card.  Plan:   Telephone follow up appointment with care management team member scheduled for:  09/22/2023 at 2:30 pm        Please call the Suicide and Crisis Lifeline: 988 go to [facility] [address], [place] [phone]) call 911 if you are experiencing a Mental Health or Behavioral Health Crisis or need someone to talk to.  Patient verbalizes understanding of instructions and care plan provided today and agrees to view in MyChart. Active MyChart status and patient understanding of how to access instructions and care plan via MyChart confirmed with patient.     [name] [name] [name], PhD [facility], [facility] Social Worker Direct Dial: [phone]  Fax: [phone]

## 2023-09-13 ENCOUNTER — Ambulatory Visit (HOSPITAL_COMMUNITY): Admitting: Vascular Surgery

## 2023-09-13 ENCOUNTER — Ambulatory Visit (HOSPITAL_COMMUNITY)

## 2023-09-15 ENCOUNTER — Other Ambulatory Visit: Payer: Self-pay

## 2023-09-15 DIAGNOSIS — E7800 Pure hypercholesterolemia, unspecified: Secondary | ICD-10-CM | POA: Diagnosis not present

## 2023-09-15 DIAGNOSIS — E039 Hypothyroidism, unspecified: Secondary | ICD-10-CM | POA: Diagnosis not present

## 2023-09-15 DIAGNOSIS — M1990 Unspecified osteoarthritis, unspecified site: Secondary | ICD-10-CM | POA: Diagnosis not present

## 2023-09-15 NOTE — Patient Instructions (Signed)
 Visit Information  Thank you for taking time to visit with me today. Please don't hesitate to contact me if I can be of assistance to you before our next scheduled appointment.  Your next care management appointment is by telephone on 10/13/23 at 2 PM  Please call the care guide team at [phone] if you need to cancel, schedule, or reschedule an appointment.   Please call the Suicide and Crisis Lifeline: 988 call 1-800-273-TALK (toll free, 24 hour hotline) if you are experiencing a Mental Health or Behavioral Health Crisis or need someone to talk to.  Rosaline Finlay, RN MSN [place]  VBCI Population Health RN Care Manager Direct Dial: [phone]  Fax: [phone]

## 2023-09-15 NOTE — Patient Outreach (Signed)
 Complex Care Management   Visit Note  09/15/2023  Name:  Whitney Blackburn MRN: [id number] DOB: Sep 20, 1955  Situation: Referral received for Complex Care Management related to SDOH Barriers:  Transportation and decreased mobility I obtained verbal consent from Patient.  Visit completed with [facility]  on the phone  Background:   Past Medical History:  Diagnosis Date   Allergy    Anemia    Arthritis    Cataracts, bilateral    Chicken pox    Depression    Eating disorder    1980's   Fainting spell 1980's   noted in 1980's   Glaucoma    Heart murmur    Hyperlipidemia    Hypertension    noted high readings in the past, per has not been to MD in 3 years   Macular degeneration    Skin rash    Ulcer 1980    Assessment: Patient Reported Symptoms:  Cognitive Cognitive Status: Able to follow simple commands, Alert and oriented to person, place, and time, Normal speech and language skills (Struggling with memory recall related to finding the word and remembering names) Cognitive/Intellectual Conditions Management [RPT]: None reported or documented in medical history or problem list      Neurological Neurological Review of Symptoms: Not assessed    HEENT HEENT Symptoms Reported: Not assessed      Cardiovascular Cardiovascular Symptoms Reported: No symptoms reported Does patient have uncontrolled Hypertension?: No (Patient does have BP cuff) Cardiovascular Management Strategies: Medication therapy, Routine screening  Respiratory Respiratory Symptoms Reported: No symptoms reported    Endocrine Endocrine Symptoms Reported: No symptoms reported Is patient diabetic?: Yes Is patient checking blood sugars at home?: No (Patient does have a monitor to check if needed) Endocrine Comment: Patient reports she received Mounjaro and has taken her first dose. She has not noticed any side effects from medication. Reviewed safe disposal for needles.  Gastrointestinal Gastrointestinal  Symptoms Reported: Not assessed      Genitourinary Genitourinary Symptoms Reported: Not assessed    Integumentary Integumentary Symptoms Reported: No symptoms reported Skin Management Strategies: Coping strategies Skin Comment: Patient reports she has mammogram scheduled 10/25/23.  Musculoskeletal Musculoskelatal Symptoms Reviewed: Difficulty walking, Unsteady gait Additional Musculoskeletal Details: Patient denies issues with lymphedema at time of visit Musculoskeletal Management Strategies: Adequate rest, Medication therapy Falls in the past year?: Yes Number of falls in past year: 2 or more (No falls since previous CMRN visit) Was there an injury with Fall?: No Fall Risk Category Calculator: 2 Patient Fall Risk Level: Moderate Fall Risk Patient at Risk for Falls Due to: History of fall(s), Impaired balance/gait Fall risk Follow up: Falls evaluation completed, Education provided, Falls prevention discussed  Psychosocial Psychosocial Symptoms Reported: Not assessed            08/18/2023   10:05 AM  Depression screen PHQ 2/9  Decreased Interest 0  Down, Depressed, Hopeless 1  PHQ - 2 Score 1    There were no vitals filed for this visit.  Medications Reviewed Today     Reviewed by Arno Rosaline SQUIBB, RN (Registered Nurse) on 09/15/23 at 1407  Med List Status: <None>   Medication Order Taking? Sig Documenting Provider Last Dose Status Informant  acetaminophen  (TYLENOL ) 500 MG tablet [id number]  Take 500 mg by mouth every 6 (six) hours as needed. [provider]  Active Self           Med Note LEONARDO SUZEN CROME   Tue Oct 14, 2020  3:21 PM)  Takes 2 tablets every morning and 2 tablets every evening  atorvastatin  (LIPITOR) 80 MG tablet [id number]  Take 80 mg by mouth daily.  Patient not taking: Reported on 08/25/2023   [provider]  Active Self  DULoxetine  (CYMBALTA ) 30 MG capsule [id number]  Take 90 mg by mouth at bedtime. Takes 30mg  and 60mg  at night  [provider]  Active Self  ezetimibe  (ZETIA ) 10 MG tablet [id number]  Take 10 mg by mouth daily. [provider]  Active Self  famotidine (PEPCID) 20 MG tablet [id number]  Take 20 mg by mouth daily. [provider]  Active Self  glipiZIDE (GLUCOTROL XL) 10 MG 24 hr tablet [id number]  Take 10 mg by mouth daily with breakfast.  Patient not taking: Reported on 08/25/2023   [provider]  Active Self  hydrochlorothiazide  (HYDRODIURIL ) 25 MG tablet [id number]  Take 25 mg by mouth daily.  Patient not taking: Reported on 08/25/2023   [provider]  Active Self  levothyroxine  (SYNTHROID , LEVOTHROID) 100 MCG tablet [id number]  Take 1 tablet (100 mcg total) by mouth daily. Mahlon Comer BRAVO, MD  Expired 08/25/23 2359 Self  lisinopril  (PRINIVIL ,ZESTRIL ) 40 MG tablet [id number]  Take 40 mg by mouth daily. [provider]  Active Self  Melatonin 3 MG TABS [id number]  Take 3 mg by mouth. [provider]  Active Self  metFORMIN  (GLUCOPHAGE -XR) 500 MG 24 hr tablet [id number] Yes Take 500 mg by mouth daily. 2 tabs once a day [provider]  Active Self  metoprolol  succinate (TOPROL -XL) 25 MG 24 hr tablet [id number]  Take 50 mg by mouth daily. [provider]  Active Self  Multiple Vitamins-Minerals (CENTRUM SILVER 50+WOMEN PO) [id number]  Take by mouth. [provider]  Active Self  Potassium 99 MG TABS [id number]  Take 99 mg by mouth. [provider]  Active Self  rosuvastatin (CRESTOR) 40 MG tablet [id number]  Take 40 mg by mouth daily. Aisha Harvey, MD  Active   tirzepatide [facility]) 5 MG/0.5ML Pen [id number] Yes Inject 5 mg into the skin once a week. Aisha Harvey, MD  Active   Vitamin D, Ergocalciferol, (DRISDOL) 1.25 MG (50000 UNIT) CAPS capsule [id number]  Take 50,000 Units by mouth every 7 (seven) days.  Patient not taking: Reported on 09/15/2023   [provider]  Active Self             Recommendation:   Continue Current Plan of Care  Follow Up Plan:   Telephone follow up appointment date/time:  10/13/23 at 2 PM  Rosaline Finlay, RN MSN [place]  [facility] RN Care Manager Direct Dial: [phone]  Fax: [phone]

## 2023-09-22 ENCOUNTER — Other Ambulatory Visit: Payer: Self-pay | Admitting: Licensed Clinical Social Worker

## 2023-09-22 NOTE — Patient Instructions (Signed)

## 2023-09-22 NOTE — Patient Outreach (Signed)
 Complex Care Management   Visit Note  09/22/2023  Name:  Whitney Blackburn MRN: [id number] DOB: May 28, 1955  Situation: Referral received for Complex Care Management related to SDOH Barriers:  Transportation I obtained verbal consent from Patient.  Visit completed with patient  on the phone  Background:   Past Medical History:  Diagnosis Date   Allergy    Anemia    Arthritis    Cataracts, bilateral    Chicken pox    Depression    Eating disorder    1980's   Fainting spell 1980's   noted in 1980's   Glaucoma    Heart murmur    Hyperlipidemia    Hypertension    noted high readings in the past, per has not been to MD in 3 years   Macular degeneration    Skin rash    Ulcer 1980    Assessment: Patient received the SCAT part A and SW has sent Part B to PCP. Patient stated that she has it from here and requested no follow up.    SDOH Interventions    Flowsheet Row Patient Outreach Telephone from 09/01/2023 in [place] [facility] DEPARTMENT Patient Outreach Telephone from 08/18/2023 in [place] POP[facility] DEPARTMENT Telephone from 08/03/2023 in [place] POPULATION HEALTH DEPARTMENT  SDOH Interventions     Food Insecurity Interventions Intervention Not Indicated AMB Referral  [Patient reports she does not have issues affording food, but difficulty obtaining due to transportation. Patient orders groceries online and purchases using money from her 401K. Unsure if she would qualify for food stamps. BSW referral.] --  Housing Interventions Intervention Not Indicated Intervention Not Indicated --  Transportation Interventions Patient Resources (Friends/Family), SCAT (Specialized Community Area [employer]poration)  [Wants to review the SCAT information] Other (Comment)  [Patient has been provided resources from care guide. BSW appt scheduled 09/01/23] Community Resources Provided  Utilities Interventions Intervention Not Indicated Intervention Not Indicated --       Recommendation:   none  Follow Up Plan:   Closing From:  Complex Care Management  Whitney Blackburn Whitney [name], PhD [name], [facility] Social Worker Direct Dial: [phone]  Fax: [phone]

## 2023-10-13 ENCOUNTER — Other Ambulatory Visit: Payer: Self-pay

## 2023-10-13 NOTE — Patient Outreach (Signed)
 Complex Care Management   Visit Note  10/13/2023  Name:  Whitney Blackburn MRN: [id number] DOB: 03-10-1955  Situation: Referral received for Complex Care Management related to decreased mobility I obtained verbal consent from Patient.  Visit completed with Patient  on the phone  Background:   Past Medical History:  Diagnosis Date   Allergy    Anemia    Arthritis    Cataracts, bilateral    Chicken pox    Depression    Eating disorder    1980's   Fainting spell 1980's   noted in 1980's   Glaucoma    Heart murmur    Hyperlipidemia    Hypertension    noted high readings in the past, per has not been to MD in 3 years   Macular degeneration    Skin rash    Ulcer 1980    Assessment: Patient Reported Symptoms:  Cognitive Cognitive Status: Able to follow simple commands, Alert and oriented to person, place, and time, Normal speech and language skills Cognitive/Intellectual Conditions Management [RPT]: None reported or documented in medical history or problem list      Neurological Neurological Review of Symptoms: No symptoms reported    HEENT HEENT Symptoms Reported: Other: HEENT Management Strategies: Medical device    Cardiovascular Cardiovascular Symptoms Reported: No symptoms reported Does patient have uncontrolled Hypertension?: No    Respiratory Respiratory Symptoms Reported: No symptoms reported    Endocrine Endocrine Symptoms Reported: No symptoms reported Is patient diabetic?: Yes Is patient checking blood sugars at home?: No    Gastrointestinal Gastrointestinal Symptoms Reported: No symptoms reported Additional Gastrointestinal Details: Last BM yesterday      Genitourinary Genitourinary Symptoms Reported: Incontinence Genitourinary Management Strategies: Incontinence garment/pad  Integumentary Integumentary Symptoms Reported: No symptoms reported    Musculoskeletal Musculoskelatal Symptoms Reviewed: Difficulty walking, Unsteady gait Musculoskeletal  Management Strategies: Adequate rest, Medication therapy Falls in the past year?: Yes Number of falls in past year: 2 or more (No falls since previous visit) Was there an injury with Fall?: No Fall Risk Category Calculator: 2 Patient Fall Risk Level: Moderate Fall Risk Patient at Risk for Falls Due to: History of fall(s), Impaired balance/gait Fall risk Follow up: Falls evaluation completed, Education provided, Falls prevention discussed  Psychosocial Psychosocial Symptoms Reported: Alteration in sleep habits Additional Psychological Details: Patient reports increased sleep during the day. She is having difficulty sleeping through the night, only sleeping about 2-3 hours despite compliance with Melatonin.          10/13/2023    PHQ2-9 Depression Screening   Little interest or pleasure in doing things    Feeling down, depressed, or hopeless    PHQ-2 - Total Score    Trouble falling or staying asleep, or sleeping too much    Feeling tired or having little energy    Poor appetite or overeating     Feeling bad about yourself - or that you are a failure or have let yourself or your family down    Trouble concentrating on things, such as reading the newspaper or watching television    Moving or speaking so slowly that other people could have noticed.  Or the opposite - being so fidgety or restless that you have been moving around a lot more than usual    Thoughts that you would be better off dead, or hurting yourself in some way    PHQ2-9 Total Score    If you checked off any problems, how difficult have these problems made  it for you to do your work, take care of things at home, or get along with other people    Depression Interventions/Treatment      There were no vitals filed for this visit.  Medications Reviewed Today     Reviewed by Arno Rosaline SQUIBB, RN (Registered Nurse) on 10/13/23 at 1402  Med List Status: <None>   Medication Order Taking? Sig Documenting Provider Last Dose  Status Informant  acetaminophen  (TYLENOL ) 500 MG tablet [id number]  Take 500 mg by mouth every 6 (six) hours as needed. [provider]  Active Self           Med Note LEONARDO, [facility]   Tue Oct 14, 2020  3:21 PM) Takes 2 tablets every morning and 2 tablets every evening  atorvastatin  (LIPITOR) 80 MG tablet [id number]  Take 80 mg by mouth daily.  Patient not taking: Reported on 08/25/2023   [provider]  Active Self  DULoxetine  (CYMBALTA ) 30 MG capsule [id number]  Take 90 mg by mouth at bedtime. Takes 30mg  and 60mg  at night [provider]  Active Self  ezetimibe  (ZETIA ) 10 MG tablet [id number]  Take 10 mg by mouth daily. [provider]  Active Self  famotidine (PEPCID) 20 MG tablet [id number]  Take 20 mg by mouth daily. [provider]  Active Self  glipiZIDE (GLUCOTROL XL) 10 MG 24 hr tablet [id number]  Take 10 mg by mouth daily with breakfast.  Patient not taking: Reported on 08/25/2023   [provider]  Active Self  hydrochlorothiazide  (HYDRODIURIL ) 25 MG tablet [id number]  Take 25 mg by mouth daily.  Patient not taking: Reported on 08/25/2023   [provider]  Active Self  levothyroxine  (SYNTHROID , LEVOTHROID) 100 MCG tablet [id number]  Take 1 tablet (100 mcg total) by mouth daily. Mahlon Comer BRAVO, MD  Expired 08/25/23 2359 Self  lisinopril  (PRINIVIL ,ZESTRIL ) 40 MG tablet [id number]  Take 40 mg by mouth daily. [provider]  Active Self  Melatonin 3 MG TABS [id number]  Take 3 mg by mouth. [provider]  Active Self  metFORMIN  (GLUCOPHAGE -XR) 500 MG 24 hr tablet [id number]  Take 500 mg by mouth daily. 2 tabs once a day [provider]  Active Self  metoprolol  succinate (TOPROL -XL) 25 MG 24 hr tablet [id number]  Take 50 mg by mouth daily. [provider]  Active Self  Multiple Vitamins-Minerals (CENTRUM SILVER 50+WOMEN PO) [id number]  Take by mouth. [provider]  Active Self   Potassium 99 MG TABS [id number]  Take 99 mg by mouth. [provider]  Active Self  rosuvastatin (CRESTOR) 40 MG tablet [id number]  Take 40 mg by mouth daily. Aisha Harvey, MD  Active   tirzepatide [facility]) 5 MG/0.5ML Pen [id number]  Inject 5 mg into the skin once a week. Aisha Harvey, MD  Active   Vitamin D, Ergocalciferol, (DRISDOL) 1.25 MG (50000 UNIT) CAPS capsule [id number]  Take 50,000 Units by mouth every 7 (seven) days.  Patient not taking: Reported on 09/15/2023   [provider]  Active Self            Recommendation:   PCP Follow-up Continue Current Plan of Care Work on practicing healthy sleep hygiene habits as we discussed  Follow Up Plan:   Telephone follow up appointment date/time:  11/10/23 at 2 PM  Rosaline Arno, RN MSN [place]  [facility] RN Care Manager Direct Dial: [phone]  Fax: [phone]

## 2023-10-13 NOTE — Patient Instructions (Signed)
 Visit Information  Thank you for taking time to visit with me today. Please don't hesitate to contact me if I can be of assistance to you before our next scheduled appointment.  Your next care management appointment is by telephone on 11/10/23 at 2 PM  Please call the care guide team at [phone] if you need to cancel, schedule, or reschedule an appointment.   Please call the Suicide and Crisis Lifeline: 988 call 1-800-273-TALK (toll free, 24 hour hotline) if you are experiencing a Mental Health or Behavioral Health Crisis or need someone to talk to.  Rosaline Finlay, RN MSN [place]  [facility] RN Care Manager Direct Dial: [phone]  Fax: [phone]

## 2023-10-16 DIAGNOSIS — E7800 Pure hypercholesterolemia, unspecified: Secondary | ICD-10-CM | POA: Diagnosis not present

## 2023-10-16 DIAGNOSIS — M1990 Unspecified osteoarthritis, unspecified site: Secondary | ICD-10-CM | POA: Diagnosis not present

## 2023-10-16 DIAGNOSIS — E039 Hypothyroidism, unspecified: Secondary | ICD-10-CM | POA: Diagnosis not present

## 2023-10-25 DIAGNOSIS — G4733 Obstructive sleep apnea (adult) (pediatric): Secondary | ICD-10-CM | POA: Diagnosis not present

## 2023-10-25 DIAGNOSIS — S80812A Abrasion, left lower leg, initial encounter: Secondary | ICD-10-CM | POA: Diagnosis not present

## 2023-10-25 DIAGNOSIS — Z1231 Encounter for screening mammogram for malignant neoplasm of breast: Secondary | ICD-10-CM | POA: Diagnosis not present

## 2023-10-25 DIAGNOSIS — R21 Rash and other nonspecific skin eruption: Secondary | ICD-10-CM | POA: Diagnosis not present

## 2023-10-31 DIAGNOSIS — I739 Peripheral vascular disease, unspecified: Secondary | ICD-10-CM | POA: Diagnosis not present

## 2023-10-31 DIAGNOSIS — S80812A Abrasion, left lower leg, initial encounter: Secondary | ICD-10-CM | POA: Diagnosis not present

## 2023-10-31 DIAGNOSIS — R21 Rash and other nonspecific skin eruption: Secondary | ICD-10-CM | POA: Diagnosis not present

## 2023-11-02 DIAGNOSIS — I890 Lymphedema, not elsewhere classified: Secondary | ICD-10-CM | POA: Diagnosis not present

## 2023-11-02 DIAGNOSIS — R2689 Other abnormalities of gait and mobility: Secondary | ICD-10-CM | POA: Diagnosis not present

## 2023-11-02 DIAGNOSIS — E1142 Type 2 diabetes mellitus with diabetic polyneuropathy: Secondary | ICD-10-CM | POA: Diagnosis not present

## 2023-11-02 DIAGNOSIS — E039 Hypothyroidism, unspecified: Secondary | ICD-10-CM | POA: Diagnosis not present

## 2023-11-02 DIAGNOSIS — Z7185 Encounter for immunization safety counseling: Secondary | ICD-10-CM | POA: Diagnosis not present

## 2023-11-04 DIAGNOSIS — H409 Unspecified glaucoma: Secondary | ICD-10-CM | POA: Diagnosis not present

## 2023-11-04 DIAGNOSIS — L400 Psoriasis vulgaris: Secondary | ICD-10-CM | POA: Diagnosis not present

## 2023-11-04 DIAGNOSIS — I10 Essential (primary) hypertension: Secondary | ICD-10-CM | POA: Diagnosis not present

## 2023-11-04 DIAGNOSIS — G5603 Carpal tunnel syndrome, bilateral upper limbs: Secondary | ICD-10-CM | POA: Diagnosis not present

## 2023-11-04 DIAGNOSIS — G4733 Obstructive sleep apnea (adult) (pediatric): Secondary | ICD-10-CM | POA: Diagnosis not present

## 2023-11-04 DIAGNOSIS — E039 Hypothyroidism, unspecified: Secondary | ICD-10-CM | POA: Diagnosis not present

## 2023-11-04 DIAGNOSIS — M1990 Unspecified osteoarthritis, unspecified site: Secondary | ICD-10-CM | POA: Diagnosis not present

## 2023-11-04 DIAGNOSIS — H3530 Unspecified macular degeneration: Secondary | ICD-10-CM | POA: Diagnosis not present

## 2023-11-04 DIAGNOSIS — E785 Hyperlipidemia, unspecified: Secondary | ICD-10-CM | POA: Diagnosis not present

## 2023-11-04 DIAGNOSIS — I890 Lymphedema, not elsewhere classified: Secondary | ICD-10-CM | POA: Diagnosis not present

## 2023-11-04 DIAGNOSIS — E1142 Type 2 diabetes mellitus with diabetic polyneuropathy: Secondary | ICD-10-CM | POA: Diagnosis not present

## 2023-11-04 DIAGNOSIS — H9190 Unspecified hearing loss, unspecified ear: Secondary | ICD-10-CM | POA: Diagnosis not present

## 2023-11-04 DIAGNOSIS — I872 Venous insufficiency (chronic) (peripheral): Secondary | ICD-10-CM | POA: Diagnosis not present

## 2023-11-04 DIAGNOSIS — L97821 Non-pressure chronic ulcer of other part of left lower leg limited to breakdown of skin: Secondary | ICD-10-CM | POA: Diagnosis not present

## 2023-11-04 DIAGNOSIS — Z7982 Long term (current) use of aspirin: Secondary | ICD-10-CM | POA: Diagnosis not present

## 2023-11-04 DIAGNOSIS — Z7984 Long term (current) use of oral hypoglycemic drugs: Secondary | ICD-10-CM | POA: Diagnosis not present

## 2023-11-04 DIAGNOSIS — Z7985 Long-term (current) use of injectable non-insulin antidiabetic drugs: Secondary | ICD-10-CM | POA: Diagnosis not present

## 2023-11-10 ENCOUNTER — Other Ambulatory Visit: Payer: Self-pay

## 2023-11-10 DIAGNOSIS — Z7982 Long term (current) use of aspirin: Secondary | ICD-10-CM | POA: Diagnosis not present

## 2023-11-10 DIAGNOSIS — Z7984 Long term (current) use of oral hypoglycemic drugs: Secondary | ICD-10-CM | POA: Diagnosis not present

## 2023-11-10 DIAGNOSIS — M1990 Unspecified osteoarthritis, unspecified site: Secondary | ICD-10-CM | POA: Diagnosis not present

## 2023-11-10 DIAGNOSIS — H3530 Unspecified macular degeneration: Secondary | ICD-10-CM | POA: Diagnosis not present

## 2023-11-10 DIAGNOSIS — E039 Hypothyroidism, unspecified: Secondary | ICD-10-CM | POA: Diagnosis not present

## 2023-11-10 DIAGNOSIS — G5603 Carpal tunnel syndrome, bilateral upper limbs: Secondary | ICD-10-CM | POA: Diagnosis not present

## 2023-11-10 DIAGNOSIS — E1142 Type 2 diabetes mellitus with diabetic polyneuropathy: Secondary | ICD-10-CM | POA: Diagnosis not present

## 2023-11-10 DIAGNOSIS — E785 Hyperlipidemia, unspecified: Secondary | ICD-10-CM | POA: Diagnosis not present

## 2023-11-10 DIAGNOSIS — I872 Venous insufficiency (chronic) (peripheral): Secondary | ICD-10-CM | POA: Diagnosis not present

## 2023-11-10 DIAGNOSIS — I890 Lymphedema, not elsewhere classified: Secondary | ICD-10-CM | POA: Diagnosis not present

## 2023-11-10 DIAGNOSIS — H9190 Unspecified hearing loss, unspecified ear: Secondary | ICD-10-CM | POA: Diagnosis not present

## 2023-11-10 DIAGNOSIS — I10 Essential (primary) hypertension: Secondary | ICD-10-CM | POA: Diagnosis not present

## 2023-11-10 DIAGNOSIS — L400 Psoriasis vulgaris: Secondary | ICD-10-CM | POA: Diagnosis not present

## 2023-11-10 DIAGNOSIS — G4733 Obstructive sleep apnea (adult) (pediatric): Secondary | ICD-10-CM | POA: Diagnosis not present

## 2023-11-10 DIAGNOSIS — L97821 Non-pressure chronic ulcer of other part of left lower leg limited to breakdown of skin: Secondary | ICD-10-CM | POA: Diagnosis not present

## 2023-11-10 DIAGNOSIS — Z7985 Long-term (current) use of injectable non-insulin antidiabetic drugs: Secondary | ICD-10-CM | POA: Diagnosis not present

## 2023-11-10 DIAGNOSIS — H409 Unspecified glaucoma: Secondary | ICD-10-CM | POA: Diagnosis not present

## 2023-11-10 NOTE — Patient Instructions (Signed)
 Visit Information  Thank you for taking time to visit with me today. Please don't hesitate to contact me if I can be of assistance to you before our next scheduled appointment.  Your next care management appointment is by telephone on 12/08/23 at 2 PM  Please call the care guide team at [phone] if you need to cancel, schedule, or reschedule an appointment.   Please call the Suicide and Crisis Lifeline: 988 call 1-800-273-TALK (toll free, 24 hour hotline) if you are experiencing a Mental Health or Behavioral Health Crisis or need someone to talk to.  Rosaline Finlay, RN MSN [place]  [facility] RN Care Manager Direct Dial: [phone]  Fax: [phone]'

## 2023-11-10 NOTE — Patient Outreach (Signed)
 Complex Care Management   Visit Note  11/10/2023  Name:  Whitney Blackburn MRN: [id number] DOB: 1955-04-19  Situation: Referral received for Complex Care Management related to decreased mobility I obtained verbal consent from Patient.  Visit completed with Patient  on the phone  Background:   Past Medical History:  Diagnosis Date   Allergy    Anemia    Arthritis    Cataracts, bilateral    Chicken pox    Depression    Eating disorder    1980's   Fainting spell 1980's   noted in 1980's   Glaucoma    Heart murmur    Hyperlipidemia    Hypertension    noted high readings in the past, per has not been to MD in 3 years   Macular degeneration    Skin rash    Ulcer 1980    Assessment: Patient Reported Symptoms:  Cognitive Cognitive Status: Able to follow simple commands, Alert and oriented to person, place, and time, Normal speech and language skills Cognitive/Intellectual Conditions Management [RPT]: None reported or documented in medical history or problem list      Neurological Neurological Review of Symptoms: No symptoms reported    HEENT HEENT Symptoms Reported: Other: HEENT Management Strategies: Medical device    Cardiovascular Cardiovascular Symptoms Reported: No symptoms reported Does patient have uncontrolled Hypertension?: No Cardiovascular Management Strategies: Medication therapy, Routine screening  Respiratory Respiratory Symptoms Reported: No symptoms reported    Endocrine Endocrine Symptoms Reported: No symptoms reported Is patient diabetic?: Yes Is patient checking blood sugars at home?: No    Gastrointestinal Gastrointestinal Symptoms Reported: No symptoms reported Additional Gastrointestinal Details: Patient reports appetite is a little down with taking Mounjaro. She reports regular BMs.      Genitourinary Genitourinary Symptoms Reported: Incontinence Genitourinary Management Strategies: Incontinence garment/pad  Integumentary Integumentary Symptoms  Reported: Wound Additional Integumentary Details: Patient has had 2 office visits since previous CMRN visit for LLE wound. Patient reports RN through [facility] has been coming to the home to assess LLE wound. Patient reports wound is covered vaseline gauze and coban. Dressing is to be changed once a week, and patient reports she is able to do it on her own. Patient reports she does have necessary supplies to complete dressing changes at home. Patient reports she cancelled upcoming appointment with PCP because she feels that wound is healing. Skin Management Strategies: Dressing changes, Routine screening  Musculoskeletal Musculoskelatal Symptoms Reviewed: Difficulty walking, Unsteady gait Musculoskeletal Management Strategies: Medical device, Adequate rest, Medication therapy (Wheelchair) Musculoskeletal Comment: Patient reports she is getting PT through [facility]. Today was her first visit. She reports their goal is to get her back to using walker instead of wheelchair. She is also working to get hospital bed Falls in the past year?: Yes Number of falls in past year: 2 or more (No falls since previous visit) Was there an injury with Fall?: No Fall Risk Category Calculator: 2 Patient Fall Risk Level: Moderate Fall Risk Patient at Risk for Falls Due to: History of fall(s), Impaired balance/gait Fall risk Follow up: Falls evaluation completed, Education provided, Falls prevention discussed  Psychosocial Psychosocial Symptoms Reported: Alteration in sleep habits, Depression - if selected complete PHQ 2-9 Additional Psychological Details: Patient reports she is doing very bad sleeping. She is having difficulty going to sleep and has had frequent visitors this week so she has not been able to sleep as much during the day. Patient is hopeful that working with PT and getting back on her feet  will help her mental health. Behavioral Management Strategies: Coping strategies, Support system Major  Change/Loss/Stressor/Fears (CP): Medical condition, self Techniques to Cope with Loss/Stress/Change: Diversional activities      11/10/2023    PHQ2-9 Depression Screening   Little interest or pleasure in doing things Not at all  Feeling down, depressed, or hopeless Several days  PHQ-2 - Total Score 1  Trouble falling or staying asleep, or sleeping too much    Feeling tired or having little energy    Poor appetite or overeating     Feeling bad about yourself - or that you are a failure or have let yourself or your family down    Trouble concentrating on things, such as reading the newspaper or watching television    Moving or speaking so slowly that other people could have noticed.  Or the opposite - being so fidgety or restless that you have been moving around a lot more than usual    Thoughts that you would be better off dead, or hurting yourself in some way    PHQ2-9 Total Score    If you checked off any problems, how difficult have these problems made it for you to do your work, take care of things at home, or get along with other people    Depression Interventions/Treatment      There were no vitals filed for this visit.  Medications Reviewed Today     Reviewed by Arno Rosaline SQUIBB, RN (Registered Nurse) on 11/10/23 at 1424  Med List Status: <None>   Medication Order Taking? Sig Documenting Provider Last Dose Status Informant  acetaminophen  (TYLENOL ) 500 MG tablet [id number]  Take 500 mg by mouth every 6 (six) hours as needed. [provider]  Active Self           Med Note LEONARDO, [facility]   Tue Oct 14, 2020  3:21 PM) Takes 2 tablets every morning and 2 tablets every evening  atorvastatin  (LIPITOR) 80 MG tablet [id number]  Take 80 mg by mouth daily.  Patient not taking: Reported on 08/25/2023   [provider]  Active Self  DULoxetine  (CYMBALTA ) 30 MG capsule [id number]  Take 90 mg by mouth at bedtime. Takes 30mg  and 60mg  at night [provider]   Active Self  ezetimibe  (ZETIA ) 10 MG tablet [id number]  Take 10 mg by mouth daily. [provider]  Active Self  famotidine (PEPCID) 20 MG tablet [id number]  Take 20 mg by mouth daily. [provider]  Active Self  glipiZIDE (GLUCOTROL XL) 10 MG 24 hr tablet [id number]  Take 10 mg by mouth daily with breakfast.  Patient not taking: Reported on 08/25/2023   [provider]  Active Self  hydrochlorothiazide  (HYDRODIURIL ) 25 MG tablet [id number]  Take 25 mg by mouth daily.  Patient not taking: Reported on 08/25/2023   [provider]  Active Self  levothyroxine  (SYNTHROID , LEVOTHROID) 100 MCG tablet [id number]  Take 1 tablet (100 mcg total) by mouth daily. Mahlon Comer BRAVO, MD  Expired 08/25/23 2359 Self  lisinopril  (PRINIVIL ,ZESTRIL ) 40 MG tablet [id number]  Take 40 mg by mouth daily. [provider]  Active Self  Melatonin 3 MG TABS [id number]  Take 3 mg by mouth. [provider]  Active Self  metFORMIN  (GLUCOPHAGE -XR) 500 MG 24 hr tablet [id number]  Take 500 mg by mouth daily. 2 tabs once a day [provider]  Active Self  metoprolol  succinate (TOPROL -XL) 25 MG 24 hr tablet [id number]  Take 50 mg  by mouth daily. [provider]  Active Self  Multiple Vitamins-Minerals (CENTRUM SILVER 50+WOMEN PO) [id number]  Take by mouth. [provider]  Active Self  Potassium 99 MG TABS [id number]  Take 99 mg by mouth. [provider]  Active Self  rosuvastatin (CRESTOR) 40 MG tablet [id number]  Take 40 mg by mouth daily. Aisha Harvey, MD  Active   tirzepatide [facility]) 5 MG/0.5ML Pen [id number]  Inject 5 mg into the skin once a week. Aisha Harvey, MD  Active   Vitamin D, Ergocalciferol, (DRISDOL) 1.25 MG (50000 UNIT) CAPS capsule [id number]  Take 50,000 Units by mouth every 7 (seven) days.  Patient not taking: Reported on 09/15/2023   [provider]  Active Self            Recommendation:   Continue Current  Plan of Care  Follow Up Plan:   Telephone follow up appointment date/time:  12/08/23 at 2 PM  Rosaline Finlay, RN MSN   [facility] RN Care Manager Direct Dial: [phone]  Fax: [phone]

## 2023-11-14 DIAGNOSIS — H9190 Unspecified hearing loss, unspecified ear: Secondary | ICD-10-CM | POA: Diagnosis not present

## 2023-11-14 DIAGNOSIS — I890 Lymphedema, not elsewhere classified: Secondary | ICD-10-CM | POA: Diagnosis not present

## 2023-11-14 DIAGNOSIS — G4733 Obstructive sleep apnea (adult) (pediatric): Secondary | ICD-10-CM | POA: Diagnosis not present

## 2023-11-14 DIAGNOSIS — G5603 Carpal tunnel syndrome, bilateral upper limbs: Secondary | ICD-10-CM | POA: Diagnosis not present

## 2023-11-14 DIAGNOSIS — E1142 Type 2 diabetes mellitus with diabetic polyneuropathy: Secondary | ICD-10-CM | POA: Diagnosis not present

## 2023-11-14 DIAGNOSIS — L400 Psoriasis vulgaris: Secondary | ICD-10-CM | POA: Diagnosis not present

## 2023-11-14 DIAGNOSIS — E785 Hyperlipidemia, unspecified: Secondary | ICD-10-CM | POA: Diagnosis not present

## 2023-11-14 DIAGNOSIS — I872 Venous insufficiency (chronic) (peripheral): Secondary | ICD-10-CM | POA: Diagnosis not present

## 2023-11-14 DIAGNOSIS — Z7982 Long term (current) use of aspirin: Secondary | ICD-10-CM | POA: Diagnosis not present

## 2023-11-14 DIAGNOSIS — Z7984 Long term (current) use of oral hypoglycemic drugs: Secondary | ICD-10-CM | POA: Diagnosis not present

## 2023-11-14 DIAGNOSIS — I10 Essential (primary) hypertension: Secondary | ICD-10-CM | POA: Diagnosis not present

## 2023-11-14 DIAGNOSIS — M1990 Unspecified osteoarthritis, unspecified site: Secondary | ICD-10-CM | POA: Diagnosis not present

## 2023-11-14 DIAGNOSIS — Z7985 Long-term (current) use of injectable non-insulin antidiabetic drugs: Secondary | ICD-10-CM | POA: Diagnosis not present

## 2023-11-14 DIAGNOSIS — H409 Unspecified glaucoma: Secondary | ICD-10-CM | POA: Diagnosis not present

## 2023-11-14 DIAGNOSIS — H3530 Unspecified macular degeneration: Secondary | ICD-10-CM | POA: Diagnosis not present

## 2023-11-14 DIAGNOSIS — L97821 Non-pressure chronic ulcer of other part of left lower leg limited to breakdown of skin: Secondary | ICD-10-CM | POA: Diagnosis not present

## 2023-11-14 DIAGNOSIS — E039 Hypothyroidism, unspecified: Secondary | ICD-10-CM | POA: Diagnosis not present

## 2023-11-21 DIAGNOSIS — E039 Hypothyroidism, unspecified: Secondary | ICD-10-CM | POA: Diagnosis not present

## 2023-11-21 DIAGNOSIS — Z7982 Long term (current) use of aspirin: Secondary | ICD-10-CM | POA: Diagnosis not present

## 2023-11-21 DIAGNOSIS — I10 Essential (primary) hypertension: Secondary | ICD-10-CM | POA: Diagnosis not present

## 2023-11-21 DIAGNOSIS — I872 Venous insufficiency (chronic) (peripheral): Secondary | ICD-10-CM | POA: Diagnosis not present

## 2023-11-21 DIAGNOSIS — H9190 Unspecified hearing loss, unspecified ear: Secondary | ICD-10-CM | POA: Diagnosis not present

## 2023-11-21 DIAGNOSIS — E785 Hyperlipidemia, unspecified: Secondary | ICD-10-CM | POA: Diagnosis not present

## 2023-11-21 DIAGNOSIS — G4733 Obstructive sleep apnea (adult) (pediatric): Secondary | ICD-10-CM | POA: Diagnosis not present

## 2023-11-21 DIAGNOSIS — I890 Lymphedema, not elsewhere classified: Secondary | ICD-10-CM | POA: Diagnosis not present

## 2023-11-21 DIAGNOSIS — H3530 Unspecified macular degeneration: Secondary | ICD-10-CM | POA: Diagnosis not present

## 2023-11-21 DIAGNOSIS — M1990 Unspecified osteoarthritis, unspecified site: Secondary | ICD-10-CM | POA: Diagnosis not present

## 2023-11-21 DIAGNOSIS — Z7985 Long-term (current) use of injectable non-insulin antidiabetic drugs: Secondary | ICD-10-CM | POA: Diagnosis not present

## 2023-11-21 DIAGNOSIS — G5603 Carpal tunnel syndrome, bilateral upper limbs: Secondary | ICD-10-CM | POA: Diagnosis not present

## 2023-11-21 DIAGNOSIS — L97821 Non-pressure chronic ulcer of other part of left lower leg limited to breakdown of skin: Secondary | ICD-10-CM | POA: Diagnosis not present

## 2023-11-21 DIAGNOSIS — E1142 Type 2 diabetes mellitus with diabetic polyneuropathy: Secondary | ICD-10-CM | POA: Diagnosis not present

## 2023-11-21 DIAGNOSIS — H409 Unspecified glaucoma: Secondary | ICD-10-CM | POA: Diagnosis not present

## 2023-11-21 DIAGNOSIS — Z7984 Long term (current) use of oral hypoglycemic drugs: Secondary | ICD-10-CM | POA: Diagnosis not present

## 2023-11-21 DIAGNOSIS — L400 Psoriasis vulgaris: Secondary | ICD-10-CM | POA: Diagnosis not present

## 2023-12-08 ENCOUNTER — Other Ambulatory Visit: Payer: Self-pay

## 2023-12-08 NOTE — Patient Instructions (Signed)
 Visit Information  Thank you for taking time to visit with me today. Please don't hesitate to contact me if I can be of assistance to you before our next scheduled appointment.  Your next care management appointment is by telephone on 01/05/24 at 2:30 PM  I have left a message with Dr. Aisha regarding your difficulty falling asleep at nighttime.   As we discussed, contact [employer] using the number or website listed on the back of your insurance card to request the name of 2-3 in-network dental providers.  Continue to work with PT to increase your strength and balance!  Please call the care guide team at [phone] if you need to cancel, schedule, or reschedule an appointment.   Please call the Suicide and Crisis Lifeline: 988 call 1-800-273-TALK (toll free, 24 hour hotline) if you are experiencing a Mental Health or Behavioral Health Crisis or need someone to talk to.  Rosaline Finlay, RN MSN [place]  [facility] Population Health RN Care Manager Direct Dial: [phone]  Fax: [phone]

## 2023-12-08 NOTE — Patient Outreach (Signed)
 Complex Care Management   Visit Note  12/08/2023  Name:  Whitney Blackburn MRN: [id number] DOB: 10-Dec-1955  Situation: Referral received for Complex Care Management related to SDOH Barriers:  Transportation and decreased mobility and lack of support I obtained verbal consent from Patient.  Visit completed with Patient  on the phone  Background:   Past Medical History:  Diagnosis Date   Allergy    Anemia    Arthritis    Cataracts, bilateral    Chicken pox    Depression    Eating disorder    1980's   Fainting spell 1980's   noted in 1980's   Glaucoma    Heart murmur    Hyperlipidemia    Hypertension    noted high readings in the past, per has not been to MD in 3 years   Macular degeneration    Skin rash    Ulcer 1980    Assessment: Patient Reported Symptoms:  Cognitive Cognitive Status: Able to follow simple commands, Alert and oriented to person, place, and time, Normal speech and language skills Cognitive/Intellectual Conditions Management [RPT]: None reported or documented in medical history or problem list   Health Maintenance Behaviors: Annual physical exam  Neurological Neurological Review of Symptoms: Not assessed    HEENT HEENT Symptoms Reported: Mouth or teeth pain HEENT Management Strategies: Medical device HEENT Comment: Offered to start 3-way call with [employer] to ask for in-network dental providers. Patient declines, stating she will look online herself. Advised that we can call at next visit if she still has not gotten dental providers.    Cardiovascular Cardiovascular Symptoms Reported: No symptoms reported Does patient have uncontrolled Hypertension?: No Cardiovascular Management Strategies: Medication therapy, Routine screening  Respiratory Respiratory Symptoms Reported: No symptoms reported Additional Respiratory Details: Discussion regarding patient diagnosis of sleep apnea and refusal to wear CPAP. Patient continues to refuse referral to sleep medicine for  reevaluation.    Endocrine Endocrine Symptoms Reported: No symptoms reported Is patient diabetic?: Yes Is patient checking blood sugars at home?: No    Gastrointestinal Gastrointestinal Symptoms Reported: Not assessed      Genitourinary Genitourinary Symptoms Reported: Not assessed    Integumentary Integumentary Symptoms Reported: No symptoms reported Additional Integumentary Details: Patient reports HH RN has signed off. Patient reports wound has basically healed. She does keep a bandage over it so that she does not knock it on anything. Skin Management Strategies: Routine screening  Musculoskeletal Musculoskelatal Symptoms Reviewed: Difficulty walking, Unsteady gait Musculoskeletal Management Strategies: Medical device, Medication therapy, Adequate rest (Wheelchair, goal is to get to using her walker instead of wheelchair) Musculoskeletal Comment: Patient reports she continues to work with [facility] PT. Patient does not feel that PT is helping, although she reports she did use her walker last week during her session. Patient reports feeling like she is going to slip on the wood floors. She reports she does not normally wear shoes when she is inside. Advised wearing supportive, non-skid shoes for added stablility. Falls in the past year?: Yes Number of falls in past year: 2 or more Was there an injury with Fall?: No Fall Risk Category Calculator: 2 Patient Fall Risk Level: Moderate Fall Risk Patient at Risk for Falls Due to: History of fall(s), Impaired balance/gait Fall risk Follow up: Falls evaluation completed, Education provided, Falls prevention discussed  Psychosocial Psychosocial Symptoms Reported: Alteration in sleep habits, Hyperviligence Additional Psychological Details: Patient continues to report alteration in sleep habits. She reports she falls asleep around 3 AM and will  sleep until 12 PM. She reports she is getting a lot of spam calls that are waking her up (only has a landline).  Patient reports her issue is falling asleep, but once she falls asleep she stays asleep. Patient reports she has tried Melatonin, Z-Quil, and Unisam but none have helped. Call placed to PCP office to report alteration in sleep habits and ask if for alternative medication options. Behavioral Management Strategies: Coping strategies, Support system Major Change/Loss/Stressor/Fears (CP): Medical condition, self Techniques to Cope with Loss/Stress/Change: Diversional activities      12/08/2023    PHQ2-9 Depression Screening   Little interest or pleasure in doing things    Feeling down, depressed, or hopeless    PHQ-2 - Total Score    Trouble falling or staying asleep, or sleeping too much    Feeling tired or having little energy    Poor appetite or overeating     Feeling bad about yourself - or that you are a failure or have let yourself or your family down    Trouble concentrating on things, such as reading the newspaper or watching television    Moving or speaking so slowly that other people could have noticed.  Or the opposite - being so fidgety or restless that you have been moving around a lot more than usual    Thoughts that you would be better off dead, or hurting yourself in some way    PHQ2-9 Total Score    If you checked off any problems, how difficult have these problems made it for you to do your work, take care of things at home, or get along with other people    Depression Interventions/Treatment      There were no vitals filed for this visit.  Medications Reviewed Today     Reviewed by Arno Rosaline SQUIBB, RN (Registered Nurse) on 12/08/23 at 1408  Med List Status: <None>   Medication Order Taking? Sig Documenting Provider Last Dose Status Informant  acetaminophen  (TYLENOL ) 500 MG tablet [id number]  Take 500 mg by mouth every 6 (six) hours as needed. [provider]  Active Self           Med Note LEONARDO, [facility]   Tue Oct 14, 2020  3:21 PM) Takes 2 tablets  every morning and 2 tablets every evening  atorvastatin  (LIPITOR) 80 MG tablet [id number]  Take 80 mg by mouth daily.  Patient not taking: Reported on 08/25/2023   [provider]  Active Self  DULoxetine  (CYMBALTA ) 30 MG capsule [id number]  Take 90 mg by mouth at bedtime. Takes 30mg  and 60mg  at night [provider]  Active Self  ezetimibe  (ZETIA ) 10 MG tablet [id number]  Take 10 mg by mouth daily. [provider]  Active Self  famotidine (PEPCID) 20 MG tablet [id number]  Take 20 mg by mouth daily. [provider]  Active Self  glipiZIDE (GLUCOTROL XL) 10 MG 24 hr tablet [id number]  Take 10 mg by mouth daily with breakfast.  Patient not taking: Reported on 08/25/2023   [provider]  Active Self  hydrochlorothiazide  (HYDRODIURIL ) 25 MG tablet [id number]  Take 25 mg by mouth daily.  Patient not taking: Reported on 08/25/2023   [provider]  Active Self  levothyroxine  (SYNTHROID , LEVOTHROID) 100 MCG tablet [id number]  Take 1 tablet (100 mcg total) by mouth daily. Mahlon Comer BRAVO, MD  Expired 08/25/23 2359 Self  lisinopril  (PRINIVIL ,ZESTRIL ) 40 MG tablet [id number]  Take 40 mg by mouth daily. [provider]  Active Self  Melatonin 3 MG TABS [id number]  Take 3 mg by mouth. [provider]  Active Self  metFORMIN  (GLUCOPHAGE -XR) 500 MG 24 hr tablet [id number]  Take 500 mg by mouth daily. 2 tabs once a day [provider]  Active Self  metoprolol  succinate (TOPROL -XL) 25 MG 24 hr tablet [id number]  Take 50 mg by mouth daily. [provider]  Active Self  Multiple Vitamins-Minerals (CENTRUM SILVER 50+WOMEN PO) [id number]  Take by mouth. [provider]  Active Self  Potassium 99 MG TABS [id number]  Take 99 mg by mouth. [provider]  Active Self  rosuvastatin (CRESTOR) 40 MG tablet [id number]  Take 40 mg by mouth daily. Aisha Harvey, MD  Active   tirzepatide [facility]) 5 MG/0.5ML Pen [id number]   Inject 5 mg into the skin once a week. Aisha Harvey, MD  Active   Vitamin D, Ergocalciferol, (DRISDOL) 1.25 MG (50000 UNIT) CAPS capsule [id number]  Take 50,000 Units by mouth every 7 (seven) days.  Patient not taking: Reported on 09/15/2023   [provider]  Active Self            Recommendation:   PCP Follow-up Continue Current Plan of Care Call [facility] or visit patient portal to get the name of 2-3 in-network dental providers  Follow Up Plan:   Telephone follow up appointment date/time:  01/05/24 at 2:30 PM  Rosaline Finlay, RN MSN [place]  [facility] RN Care Manager Direct Dial: [phone]  Fax: [phone]

## 2024-01-05 ENCOUNTER — Other Ambulatory Visit: Payer: Self-pay

## 2024-01-05 NOTE — Patient Outreach (Addendum)
 Complex Care Management   Visit Note  01/05/2024  Name:  Whitney Blackburn MRN: [id number] DOB: 06-17-1955  Situation: Referral received for Complex Care Management related to decreased mobility, lack of social support I obtained verbal consent from Patient.  Visit completed with Patient  on the phone  Background:   Past Medical History:  Diagnosis Date   Allergy    Anemia    Arthritis    Cataracts, bilateral    Chicken pox    Depression    Eating disorder    1980's   Fainting spell 1980's   noted in 1980's   Glaucoma    Heart murmur    Hyperlipidemia    Hypertension    noted high readings in the past, per has not been to MD in 3 years   Macular degeneration    Skin rash    Ulcer 1980    Assessment: Patient Reported Symptoms:  Cognitive Cognitive Status: Able to follow simple commands, Alert and oriented to person, place, and time, Normal speech and language skills Cognitive/Intellectual Conditions Management [RPT]: None reported or documented in medical history or problem list   Health Maintenance Behaviors: Annual physical exam  Neurological Neurological Review of Symptoms: No symptoms reported    HEENT HEENT Symptoms Reported: Mouth or teeth pain HEENT Management Strategies: Medical device HEENT Comment: Patient reports she has not contact [employer] to ask for in-network dental providers. Patient continues to decline RN initiating 3-way call to request dental providers.    Cardiovascular Cardiovascular Symptoms Reported: No symptoms reported Does patient have uncontrolled Hypertension?: No Cardiovascular Management Strategies: Medication therapy, Routine screening  Respiratory Respiratory Symptoms Reported: No symptoms reported    Endocrine Endocrine Symptoms Reported: No symptoms reported Is patient diabetic?: Yes Is patient checking blood sugars at home?: No    Gastrointestinal Gastrointestinal Symptoms Reported: No symptoms reported      Genitourinary  Genitourinary Symptoms Reported: Incontinence Genitourinary Management Strategies: Incontinence garment/pad  Integumentary Integumentary Symptoms Reported: No symptoms reported Skin Management Strategies: Routine screening  Musculoskeletal Musculoskelatal Symptoms Reviewed: Difficulty walking, Unsteady gait Musculoskeletal Management Strategies: Medical device, Medication therapy, Adequate rest (Wheelchair) Musculoskeletal Comment: Patient reports PT has not made a visit in about 3 weeks because she requested they stop services. Patient denies falls since previous visit Falls in the past year?: Yes Number of falls in past year: 2 or more Was there an injury with Fall?: No Fall Risk Category Calculator: 2 Patient Fall Risk Level: Moderate Fall Risk Patient at Risk for Falls Due to: History of fall(s), Impaired balance/gait Fall risk Follow up: Falls evaluation completed, Education provided, Falls prevention discussed  Psychosocial Psychosocial Symptoms Reported: Alteration in sleep habits Additional Psychological Details: Patient reports continued issues with alteration in sleep habits. She did not hear from Dr. Raydell office reagrding alteration in sleep habits. Patient reports she thinks [facility] has taken over PCP services. Behavioral Management Strategies: Coping strategies, Support system Major Change/Loss/Stressor/Fears (CP): Medical condition, self Techniques to Cope with Loss/Stress/Change: Diversional activities Quality of Family Relationships: helpful, involved, supportive Do you feel physically threatened by others?: No    01/05/2024    PHQ2-9 Depression Screening   Little interest or pleasure in doing things    Feeling down, depressed, or hopeless    PHQ-2 - Total Score    Trouble falling or staying asleep, or sleeping too much    Feeling tired or having little energy    Poor appetite or overeating     Feeling bad about yourself - or  that you are a failure or have let  yourself or your family down    Trouble concentrating on things, such as reading the newspaper or watching television    Moving or speaking so slowly that other people could have noticed.  Or the opposite - being so fidgety or restless that you have been moving around a lot more than usual    Thoughts that you would be better off dead, or hurting yourself in some way    PHQ2-9 Total Score    If you checked off any problems, how difficult have these problems made it for you to do your work, take care of things at home, or get along with other people    Depression Interventions/Treatment      There were no vitals filed for this visit.    Medications Reviewed Today     Reviewed by Arno Rosaline SQUIBB, RN (Registered Nurse) on 01/05/24 at 1450  Med List Status: <None>   Medication Order Taking? Sig Documenting Provider Last Dose Status Informant  acetaminophen  (TYLENOL ) 500 MG tablet [id number]  Take 500 mg by mouth every 6 (six) hours as needed. [provider]  Active Self           Med Note LEONARDO, [facility] L   Tue Oct 14, 2020  3:21 PM) Takes 2 tablets every morning and 2 tablets every evening  atorvastatin  (LIPITOR) 80 MG tablet [id number]  Take 80 mg by mouth daily.  Patient not taking: Reported on 08/25/2023   [provider]  Active Self  DULoxetine  (CYMBALTA ) 30 MG capsule [id number]  Take 90 mg by mouth at bedtime. Takes 30mg  and 60mg  at night [provider]  Active Self  ezetimibe  (ZETIA ) 10 MG tablet [id number]  Take 10 mg by mouth daily. [provider]  Active Self  famotidine (PEPCID) 20 MG tablet [id number]  Take 20 mg by mouth daily. [provider]  Active Self  glipiZIDE (GLUCOTROL XL) 10 MG 24 hr tablet [id number]  Take 10 mg by mouth daily with breakfast.  Patient not taking: Reported on 08/25/2023   [provider]  Active Self  hydrochlorothiazide  (HYDRODIURIL ) 25 MG tablet [id number]  Take 25 mg by mouth daily.   Patient not taking: Reported on 08/25/2023   [provider]  Active Self  levothyroxine  (SYNTHROID , LEVOTHROID) 100 MCG tablet [id number]  Take 1 tablet (100 mcg total) by mouth daily. Mahlon Comer BRAVO, MD  Expired 08/25/23 2359 Self  lisinopril  (PRINIVIL ,ZESTRIL ) 40 MG tablet [id number]  Take 40 mg by mouth daily. [provider]  Active Self  Melatonin 3 MG TABS [id number]  Take 3 mg by mouth. [provider]  Active Self  metFORMIN  (GLUCOPHAGE -XR) 500 MG 24 hr tablet [id number]  Take 500 mg by mouth daily. 2 tabs once a day [provider]  Active Self  metoprolol  succinate (TOPROL -XL) 25 MG 24 hr tablet [id number]  Take 50 mg by mouth daily. [provider]  Active Self  Multiple Vitamins-Minerals (CENTRUM SILVER 50+WOMEN PO) [id number]  Take by mouth. [provider]  Active Self  Potassium 99 MG TABS [id number]  Take 99 mg by mouth. [provider]  Active Self  rosuvastatin (CRESTOR) 40 MG tablet [id number]  Take 40 mg by mouth daily. Aisha Harvey, MD  Active   tirzepatide [facility]) 5 MG/0.5ML Pen [id number]  Inject 5 mg into the skin once a week. Aisha Harvey, MD  Active   Vitamin D, Ergocalciferol, (DRISDOL) 1.25 MG (50000 UNIT)  CAPS capsule [id number]  Take 50,000 Units by mouth every 7 (seven) days.  Patient not taking: Reported on 09/15/2023   [provider]  Active Self            Recommendation:   Continue Current Plan of Care  Follow Up Plan:   Closing From:  Complex Care Management Patient is enrolled with [facility]'s home based care program. Confirmed with [facility] that they will be provided PCP services, with visits every 3 months.  Rosaline Finlay, RN MSN [place]  [facility] Population Health RN Care Manager Direct Dial: [phone]  Fax: [phone]

## 2024-01-05 NOTE — Patient Instructions (Signed)
 Visit Information  Thank you for taking time to visit with me today. Please don't hesitate to contact me if I can be of assistance to you before our next scheduled appointment.  Your next care management appointment is no further scheduled appointments.   Please call the care guide team at [phone] if you need to cancel, schedule, or reschedule an appointment.   Please call the Suicide and Crisis Lifeline: 988 call 1-800-273-TALK (toll free, 24 hour hotline) if you are experiencing a Mental Health or Behavioral Health Crisis or need someone to talk to.  Rosaline Finlay, RN MSN [place]  [facility] Population Health RN Care Manager Direct Dial: [phone]  Fax: [phone]
